# Patient Record
Sex: Female | Born: 1990 | Race: White | Hispanic: No | Marital: Single | Smoking: Never smoker
Health system: Southern US, Community
[De-identification: ages and names within clinical notes are randomized; demographics above are authoritative.]

## PROBLEM LIST (undated history)

## (undated) DIAGNOSIS — J069 Acute upper respiratory infection, unspecified: Secondary | ICD-10-CM

## (undated) DIAGNOSIS — Z0000 Encounter for general adult medical examination without abnormal findings: Principal | ICD-10-CM

## (undated) DIAGNOSIS — J329 Chronic sinusitis, unspecified: Principal | ICD-10-CM

## (undated) DIAGNOSIS — S62102A Fracture of unspecified carpal bone, left wrist, initial encounter for closed fracture: Secondary | ICD-10-CM

## (undated) DIAGNOSIS — T7840XA Allergy, unspecified, initial encounter: Secondary | ICD-10-CM

## (undated) DIAGNOSIS — S93409A Sprain of unspecified ligament of unspecified ankle, initial encounter: Secondary | ICD-10-CM

## (undated) HISTORY — PX: OTHER SURGICAL HISTORY: SHX169

## (undated) HISTORY — DX: Acute upper respiratory infection, unspecified: J06.9

## (undated) HISTORY — DX: Fracture of unspecified carpal bone, left wrist, initial encounter for closed fracture: S62.102A

## (undated) HISTORY — DX: Chronic sinusitis, unspecified: J32.9

## (undated) HISTORY — DX: Allergy, unspecified, initial encounter: T78.40XA

## (undated) HISTORY — DX: Sprain of unspecified ligament of unspecified ankle, initial encounter: S93.409A

## (undated) HISTORY — DX: Encounter for general adult medical examination without abnormal findings: Z00.00

---
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
## 2009-12-23 ENCOUNTER — Ambulatory Visit: Payer: Self-pay | Admitting: Family Medicine

## 2009-12-23 DIAGNOSIS — R0989 Other specified symptoms and signs involving the circulatory and respiratory systems: Secondary | ICD-10-CM | POA: Insufficient documentation

## 2009-12-23 DIAGNOSIS — L708 Other acne: Secondary | ICD-10-CM | POA: Insufficient documentation

## 2009-12-23 DIAGNOSIS — R011 Cardiac murmur, unspecified: Secondary | ICD-10-CM | POA: Insufficient documentation

## 2009-12-23 DIAGNOSIS — R0609 Other forms of dyspnea: Secondary | ICD-10-CM | POA: Insufficient documentation

## 2009-12-24 LAB — CONVERTED CEMR LAB
AST: 23 units/L (ref 0–37)
BUN: 14 mg/dL (ref 6–23)
Basophils Relative: 0.6 % (ref 0.0–3.0)
Calcium: 8.9 mg/dL (ref 8.4–10.5)
Creatinine, Ser: 0.7 mg/dL (ref 0.4–1.2)
Eosinophils Relative: 0.5 % (ref 0.0–5.0)
GFR calc non Af Amer: 113.56 mL/min (ref >60)
Glucose, Bld: 95 mg/dL (ref 70–99)
HCT: 38.6 % (ref 36.0–46.0)
Lymphs Abs: 3 10*3/uL (ref 0.7–4.0)
MCV: 93.6 fL (ref 78.0–100.0)
Monocytes Absolute: 0.6 10*3/uL (ref 0.1–1.0)
Platelets: 243 10*3/uL (ref 150.0–400.0)
Sodium: 140 meq/L (ref 135–145)
TSH: 0.42 microintl units/mL (ref 0.35–5.50)
Total Bilirubin: 1.4 mg/dL — ABNORMAL HIGH (ref 0.3–1.2)
WBC: 7.7 10*3/uL (ref 4.5–10.5)

## 2010-01-07 ENCOUNTER — Ambulatory Visit: Payer: Self-pay

## 2010-01-07 ENCOUNTER — Ambulatory Visit: Payer: Self-pay | Admitting: Cardiology

## 2010-01-07 ENCOUNTER — Ambulatory Visit (HOSPITAL_COMMUNITY): Admission: RE | Admit: 2010-01-07 | Discharge: 2010-01-07 | Payer: Self-pay | Admitting: Cardiology

## 2010-03-10 ENCOUNTER — Telehealth: Payer: Self-pay | Admitting: Family Medicine

## 2010-06-03 ENCOUNTER — Ambulatory Visit
Admission: RE | Admit: 2010-06-03 | Discharge: 2010-06-03 | Payer: Self-pay | Source: Home / Self Care | Attending: Family Medicine | Admitting: Family Medicine

## 2010-07-01 ENCOUNTER — Encounter: Payer: Self-pay | Admitting: Family Medicine

## 2010-07-02 ENCOUNTER — Encounter: Payer: Self-pay | Admitting: Family Medicine

## 2010-07-03 NOTE — Assessment & Plan Note (Signed)
Summary: NEW PT EST // RS   Vital Signs:  Patient profile:   20 year old female Menstrual status:  regular LMP:     12/07/2009 Height:      65 inches (165.10 cm) Weight:      119 pounds (54.09 kg) BMI:     19.87 O2 Sat:      98 % on Room air Temp:     98.5 degrees F (36.94 degrees C) oral Pulse rate:   71 / minute BP sitting:   108 / 70  (left arm) Cuff size:   regular  Vitals Entered By: Josph Macho RMA (December 23, 2009 3:44 PM)  O2 Flow:  Room air CC: Establish new pt/ CF Is Patient Diabetic? No LMP (date): 12/07/2009     Menstrual Status regular Enter LMP: 12/07/2009   History of Present Illness: Patient in today for new patient appointment with several concerns she has a long history of acne vulgaris on her face and would like a referral to a new Dermatologist. Has been on Yaz and Minocycline intermittently to help acne and it usually helps. Has not been taking the meds regularly at present.  She is concerned about heart murmur that had been picked up on routine exam, she had been referred to cardiology for evaluation and echocardiogram but before the echo could be performed the cardiologist actually passed away from an MI. She would like to proceed with further evaluation and is noting some symptoms of worsening DOE. She notes even with slight exercise she gets overheated, excessively sweaty and SOB to the point where she needs to rest before proceeding. She describes a family history of missing teeth and small jaws both she and her sister have it and she has already had several teeth implanted and is awaiting a bone graft and implants to lower jaw.  Preventive Screening-Counseling & Management  Alcohol-Tobacco     Smoking Status: never  Caffeine-Diet-Exercise     Does Patient Exercise: yes      Drug Use:  no.    Current Medications (verified): 1)  Yaz 3-0.02 Mg Tabs (Drospirenone-Ethinyl Estradiol) .... 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, applications Single Never Smoked Alcohol use-no Drug use-no Regular exercise-yes Occupation:  employed Smoking Status:  never Drug Use:  no Does Patient Exercise:  yes  Review of Systems  The patient denies anorexia, fever, weight loss, weight gain, vision loss, decreased hearing, hoarseness, chest pain, syncope, dyspnea on exertion, peripheral edema, prolonged cough, headaches, hemoptysis, abdominal pain, melena, hematochezia, severe indigestion/heartburn, hematuria, incontinence, genital sores, muscle weakness, suspicious skin lesions, transient blindness, difficulty walking, depression, unusual weight change, abnormal bleeding, enlarged lymph nodes, angioedema, breast masses, and testicular masses.    Physical Exam  General:  Well-developed,well-nourished,in no acute distress; alert,appropriate and cooperative throughout examination Head:  Normocephalic and atraumatic without obvious abnormalities Eyes:  No corneal or conjunctival inflammation noted. EOMI. Perrla.  Ears:  External ear exam shows no significant lesions or deformities.  Otoscopic examination reveals clear canals, tympanic membranes are intact bilaterally without  bulging, retraction, inflammation or discharge. Hearing is grossly normal bilaterally. Nose:  External nasal examination shows no deformity or inflammation. Nasal mucosa are pink and moist without lesions or exudates. Mouth:  Oral mucosa and oropharynx without lesions or  exudates.   Neck:  No deformities, masses, or tenderness noted. Lungs:  Normal respiratory effort, chest expands symmetrically. Lungs are clear to auscultation, no crackles or wheezes. Heart:  Normal rate and regular rhythm. S1 and S2 normal without gallop1 /6 systolic murmur.   Abdomen:  Bowel sounds positive,abdomen soft and non-tender without masses, organomegaly or hernias noted. Msk:  No deformity or scoliosis noted of thoracic or lumbar spine.   Pulses:  R and L carotid,dorsalis pedis and posterior tibial pulses are full and equal bilaterally Extremities:  No clubbing, cyanosis, edema, or deformity noted with normal full range of motion of all joints.   Neurologic:  No cranial nerve deficits noted. Station and gait are normal. Plantar reflexes are down-going bilaterally. DTRs are symmetrical throughout. Sensory, motor and coordinative functions appear intact. Skin:  Intact without suspicious lesions or rashes Cervical Nodes:  No lymphadenopathy noted Psych:  Cognition and judgment appear intact. Alert and cooperative with normal attention span and concentration. No apparent delusions, illusions, hallucinations   Impression & Recommendations:  Problem # 1:  PHYSICAL EXAMINATION (ICD-V70.0)  Orders: TLB-TSH (Thyroid Stimulating Hormone) (84443-TSH) TLB-Hepatic/Liver Function Pnl (80076-HEPATIC) TLB-CBC Platelet - w/Differential (85025-CBCD) TLB-BMP (Basic Metabolic Panel-BMET) (80048-METABOL) TLB-Lipid Panel (80061-LIPID) Venipuncture (78295) Specimen Handling (62130)  Problem # 2:  ACNE VULGARIS, FACIAL (ICD-706.1)  Orders: Dermatology Referral (Derma) Try Cetaphil soap, witch hazel astringent and benzoyl peroxide products as needed, may cont current meds  Problem # 3:  CARDIAC MURMUR (ICD-785.2)  Orders: Echo Referral (Echo) Cardiology Referral (Cardiology) with DOE, referred to cardiology for further evaluation  Problem # 4:  DYSPNEA ON EXERTION (ICD-786.09) Referred  for cardiac eval. report worsening symptoms  Complete Medication List: 1)  Yaz 3-0.02 Mg Tabs (Drospirenone-ethinyl estradiol) .... Once daily  Patient Instructions: 1)  Please sign release of records from [facility] 2)  Try Cetaphil Soap, Witch Hazel Astringent and Benzoyl Peroxide products daily to lesions. 3)  Please schedule a follow-up appointment in 1 year for annual exam

## 2010-07-03 NOTE — Assessment & Plan Note (Signed)
Summary: face broken out wants to get back on birth control/dt   Vital Signs:  Patient profile:   20 year old female Menstrual status:  regular Height:      65 inches (165.10 cm) Weight:      115.50 pounds (52.50 kg) O2 Sat:      100 % on Room air Temp:     98.3 degrees F (36.83 degrees C) oral Pulse rate:   74 / minute BP sitting:   117 / 74  (right arm) Cuff size:   regular  Vitals Entered By: Josph Macho RMA (June 03, 2010 9:47 AM)  O2 Flow:  Room air CC: Face broken out/ wants to be back on birth control/ CF Is Patient Diabetic? No   History of Present Illness: Patient is 20 year old Caucasian female in today for evaluation of her acne. She had a bad flare recently. She notes it has started to improve secondary to a course of doxycycline given to her by her dentist for dental work. She has had long-standing history of trouble with cystic facial acne, was on OCPs, mainly Yaz  during high school and early college to good effect. Did not have trouble with severe acne again until recently. Would like to go back on something but didn't feel emotionally well and her affect was flattened on Yaz. She does not have any menstrual concerns no heavy periods no terrible cramping is not 6 active. She tolerated the well otherwise no nausea no vomiting no changes in her cycle. She denies any recent illness, fevers, chills, chest pain, palpitations, headache, shortness of breath, GI or GU complaints. She is presently using some over-the-counter acne products with minimal effect. Her minocycline is on hold during her course of doxycycline  Current Medications (verified): 1)  Yaz 3-0.02 Mg Tabs (Drospirenone-Ethinyl Estradiol) .... Once Daily 2)  Minocycline Hcl 50 Mg Tabs (Minocycline Hcl) .... By Mouth Once Daily  Allergies (verified): No Known Drug Allergies  Past History:  Past medical history reviewed for relevance to current acute and chronic problems. Social history (including risk  factors) reviewed for relevance to current acute and chronic problems.  Social History: Reviewed history from 12/23/2009 and no changes required. Occupation:[employer]  Pre Dental Single Never Smoked Alcohol use-no Drug use-no Regular exercise-yes  Review of Systems      See HPI  Physical Exam  General:  Well-developed,well-nourished,in no acute distress; alert,appropriate and cooperative throughout examination Head:  Normocephalic and atraumatic without obvious abnormalities. No apparent alopecia or balding. Mouth:  Oral mucosa and oropharynx without lesions or exudates.  Teeth in good repair. Neck:  No deformities, masses, or tenderness noted. Lungs:  Normal respiratory effort, chest expands symmetrically. Lungs are clear to auscultation, no crackles or wheezes. Heart:  Normal rate and regular rhythm. S1 and S2 normal without gallop, murmur, click, rub or other extra sounds. Abdomen:  Bowel sounds positive,abdomen soft and non-tender without masses, organomegaly or hernias noted. Extremities:  No clubbing, cyanosis, edema, or deformity noted with normal full range of motion of all joints.   Skin:  cystic acne noted on forehead. Cervical Nodes:  slighlty enlarged lymph nodes b/l. Psych:  Cognition and judgment appear intact. Alert and cooperative with normal attention span and concentration. No apparent delusions, illusions, hallucinations   Impression & Recommendations:  Problem # 1:  ACNE VULGARIS, FACIAL (ICD-706.1)  Her updated medication list for this problem includes:    Minocycline Hcl 50 Mg Tabs (Minocycline hcl) ..... By mouth once daily  Restarted OCP with OrthoTricyclen Lo daily. If acne is improving can stop the Minocycline. Try Cetaphil Soap, Witch Hazel Astringent and Benzoyl Peroxide daily to face  Problem # 2:  CARDIAC MURMUR (ICD-785.2) No audible today and echo reassuring  Complete Medication List: 1)  Minocycline Hcl 50 Mg Tabs (Minocycline hcl) ....  By mouth once daily 2)  Ortho Tri-cyclen Lo 0.18/0.215/0.25 Mg-25 Mcg Tabs (Norgestim-eth estrad triphasic) .Marland Kitchen.. 1 tab by mouth daily  Patient Instructions: 1)  Please schedule a follow-up appointment as needed or for check up this summer while out of school. 2)  Start the Ortho Tricyclen Lo the first Sunday after your next menstrual cycle. 3)  Try Cetaphil Acne Soap daily, then cleanse with Witch Hazel Astringent after, then use Benzoyl Peroxide product on acne lesions Prescriptions: ORTHO TRI-CYCLEN LO 0.18/0.215/0.25 MG-25 MCG TABS (NORGESTIM-ETH ESTRAD TRIPHASIC) 1 tab by mouth daily  #3 packs x 3   Entered and Authorized by:   Stacey Blyth MD   Signed by:   Stacey Blyth MD on 06/03/2010   Method used:   [id number]       [address]       [place], [place]  [address]       Ph: [phone]       Fax: [phone]   RxID:   [id number]    Orders Added: 1)  Est. Patient Level II [75643]

## 2010-07-03 NOTE — Progress Notes (Signed)
Summary: New rx for Minocycline  Phone Note Call from Patient Call back at [phone]   Caller: Patient Reason for Call: Refill Medication Summary of Call: Patient called and wanted to know if you would prescibe her  Minoclycin. Her presvious dermatologist, Dr. Lovenia Kim @ [facility] was prescibing this but she is now trying to find a new dermatologist. She she was previously taking this and the YAZ for her acne. She does not want to take the YAZ anymore. If you do not prefer this medicine she said she would like something to treat her acne breakout. She is curently in school at [employer] Please advise.    [facility] [address]  Initial call taken by: Harold Barban,  March 10, 2010 12:50 PM  Follow-up for Phone Call        She can have the Minocycline 50mg  by mouth once daily is a typical maintenance dose, give #30 with 2 rf, needs to take a probiotic such as Align caps daily while on antibiotics as well. Wash with Cetaphil soap, apply Witch Hazel Astringent to entire face after that daily to bid Follow-up by: Danise Edge MD,  March 10, 2010 4:08 PM  Additional Follow-up for Phone Call Additional follow up Details #1::        Message left for pt to return my call. Need to know what pharmacy to send RX to? Additional Follow-up by: Josph Macho RMA,  March 11, 2010 9:33 AM    Additional Follow-up for Phone Call Additional follow up Details #2::    Informed pt Follow-up by: Josph Macho RMA,  March 12, 2010 8:38 AM  New/Updated Medications: MINOCYCLINE HCL 50 MG TABS (MINOCYCLINE HCL) by mouth once daily Prescriptions: MINOCYCLINE HCL 50 MG TABS (MINOCYCLINE HCL) by mouth once daily  #30 x 2   Entered by:   Josph Macho RMA   Authorized by:   Danise Edge MD   Signed by:   Josph Macho RMA on 03/12/2010   Method used:   [facility]        [facility] [phone][facility] (retail)       [address]       [place], [place]  [address]       Ph: [phone]       Fax: [phone]   RxID:   [id number]

## 2010-08-07 ENCOUNTER — Encounter: Payer: Self-pay | Admitting: Family Medicine

## 2010-08-07 ENCOUNTER — Ambulatory Visit (INDEPENDENT_AMBULATORY_CARE_PROVIDER_SITE_OTHER): Payer: BC Managed Care – PPO | Admitting: Family Medicine

## 2010-08-07 DIAGNOSIS — B354 Tinea corporis: Secondary | ICD-10-CM

## 2010-08-07 DIAGNOSIS — R0609 Other forms of dyspnea: Secondary | ICD-10-CM

## 2010-08-07 DIAGNOSIS — L708 Other acne: Secondary | ICD-10-CM

## 2010-08-10 ENCOUNTER — Encounter: Payer: Self-pay | Admitting: Family Medicine

## 2010-08-19 NOTE — Assessment & Plan Note (Signed)
Summary: follow up   Vital Signs:  Patient profile:   20 year old female Menstrual status:  regular Height:      65 inches (165.10 cm) Weight:      119.25 pounds (54.20 kg) O2 Sat:      100 % on Room air Temp:     97.7 degrees F (36.50 degrees C) oral Pulse rate:   63 / minute BP sitting:   100 / 63  (right arm) Cuff size:   regular  Vitals Entered By: Josph Macho RMA (August 07, 2010 8:39 AM)  O2 Flow:  Room air CC: Follow-up visit/ CF Is Patient Diabetic? 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 She is doing well and denies any acute illness. She is pleased with her response to the OCP, her acne is significantly better and she is not longer having freguent, new lesions. She does note some persistent scarring on her back but that is not new. No recent fevers/chills/HA/abdominal pain/congestion/CP/palp/SOB/GI or GU c/o. She has light very tolerable periods on the Ortho-Tricyclen Lo. She has noted a small, scaley slightly itching lesion on her left wrist which has been present for a couple of weeks  Current Medications (verified): 1)  Ortho Tri-Cyclen Lo 0.18/0.215/0.25 Mg-25 Mcg Tabs (Norgestim-Eth Estrad Triphasic) .Marland Kitchen.. 1 Tab By Mouth Daily  Allergies (verified): No Known Drug Allergies  Past History:  Past medical history reviewed for relevance to current acute and chronic problems. Social history (including risk factors) reviewed for relevance to current acute and chronic problems.  Social History: Reviewed history from 12/23/2009 and no changes required. Occupation:[employer]  Pre Dental Single Never Smoked Alcohol use-no Drug use-no Regular exercise-yes  Review of Systems      See HPI  Physical Exam  General:  Well-developed,well-nourished,in no acute distress; alert,appropriate and cooperative throughout examination Head:  Normocephalic and atraumatic without obvious abnormalities.  No apparent alopecia or balding. Mouth:  Oral mucosa and oropharynx without lesions or exudates.  Teeth in good repair. Neck:  No deformities, masses, or tenderness noted. Lungs:  Normal respiratory effort, chest expands symmetrically. Lungs are clear to auscultation, no crackles or wheezes. Heart:  Normal rate and regular rhythm. S1 and S2 normal without gallop, murmur, click, rub or other extra sounds. Abdomen:  Bowel sounds positive,abdomen soft and non-tender without masses, organomegaly or hernias noted. Extremities:  No clubbing, cyanosis, edema, or deformity noted with normal full range of motion of all joints.   Skin:  scarring, flat, light brown scattered over back, no active new lesions Psych:  Cognition and judgment appear intact. Alert and cooperative with normal attention span and concentration. No apparent delusions, illusions, hallucinations   Impression & Recommendations:  Problem # 1:  ACNE VULGARIS, FACIAL (ICD-706.1)  The following medications were removed from the medication list:    Minocycline Hcl 50 Mg Tabs (Minocycline hcl) ..... By mouth once daily OrthoTricyclen Lo has helped no new lesions, still some scarring on back, offered reassurance they will fade and she may try a Benzoyl Peroxide product as well  Problem # 2:  TINEA CORPORIS (ICD-110.5) On abdomen, apply Lotrimin cream two times a day and report if lesions do not resolve  Problem # 3:  DYSPNEA ON EXERTION (ICD-786.09) Improved, no c/o today  Complete Medication List: 1)  Ortho Tri-cyclen Lo 0.18/0.215/0.25 Mg-25 Mcg Tabs (Norgestim-eth estrad triphasic) .Marland Kitchen.. 1 tab by mouth daily 2)  Lotrimin Af  1 % Crea (Clotrimazole) .... Apply to lesions two times a day  Patient Instructions: 1)  Please schedule a follow-up appointment in 4-5 months for annual exam 2)  call with any concerns 3)  try a Benzoyl Peroxide lotion for skin on back 4)  Apply Lotrimin two times a day  Prescriptions: ORTHO TRI-CYCLEN LO  0.18/0.215/0.25 MG-25 MCG TABS (NORGESTIM-ETH ESTRAD TRIPHASIC) 1 tab by mouth daily  #3 x 3   Entered and Authorized by:   Danise Edge MD   Signed by:   Danise Edge MD on 08/07/2010   Method used:   [facility]        [facility] [phone][facility] (retail)       [address]       [place], [place]  [address]       Ph: [phone]       Fax: [phone]   RxID:   [id number]    Orders Added: 1)  Est. Patient Level III [25366]

## 2010-11-10 ENCOUNTER — Other Ambulatory Visit: Payer: Self-pay | Admitting: Family Medicine

## 2010-11-10 DIAGNOSIS — F419 Anxiety disorder, unspecified: Secondary | ICD-10-CM

## 2010-11-10 NOTE — Telephone Encounter (Signed)
I have attempted to contact this patient by phone with the following results: left message to return my call on answering machine.

## 2010-11-10 NOTE — Telephone Encounter (Signed)
OK for her to have a small amount of Alprazolam 0.25mg  tabs, make sure to warn her it may cause sedation so I usually recommend patient's take 1 some evening at home before they travel so they know how it affects them. Give Alprazolam 0.25mg  1-2 tabs po daily prn anxiety. Disp: #10, no rf

## 2010-11-10 NOTE — Telephone Encounter (Signed)
Leaving for [place] on Thursday would like something for anxiety for the flight.

## 2010-11-11 MED ORDER — ALPRAZOLAM 0.25 MG PO TABS: ORAL_TABLET | ORAL | Status: DC

## 2010-11-11 NOTE — Telephone Encounter (Signed)
RC from pt.  She is agreeable with plan.  Pt questioned need for something also for motion sickness.  Advised I could ask about calling in Transderm Scope patches also. Pt states she doesn't think it is that bad and states she picked up dramamine at pharmacy.  Advised she should be OK with that as alprazolam will also relax her.  Pt does have traveling companion so she will not be alone on plane and will be able to sleep.  She is agreeable.  Please advise any further instructions. Thanks

## 2010-11-11 NOTE — Telephone Encounter (Signed)
Perfect, if she calls back and changes her mind she may have a patch for motion sickness but I doubt she will need it.

## 2011-06-11 ENCOUNTER — Other Ambulatory Visit: Payer: Self-pay

## 2011-06-11 MED ORDER — NORGESTIM-ETH ESTRAD TRIPHASIC 0.18/0.215/0.25 MG-25 MCG PO TABS: 1.0000 | ORAL_TABLET | Freq: Every day | ORAL | Status: DC

## 2011-06-11 NOTE — Telephone Encounter (Signed)
Pt informed that 1 month was sent to pharmacy but pt needs an appt for further refills due to it being a year since we seen patient

## 2011-07-24 ENCOUNTER — Encounter: Payer: Self-pay | Admitting: Family Medicine

## 2011-07-24 ENCOUNTER — Ambulatory Visit (INDEPENDENT_AMBULATORY_CARE_PROVIDER_SITE_OTHER): Payer: BC Managed Care – PPO | Admitting: Family Medicine

## 2011-07-24 DIAGNOSIS — Z0000 Encounter for general adult medical examination without abnormal findings: Secondary | ICD-10-CM

## 2011-07-24 DIAGNOSIS — L708 Other acne: Secondary | ICD-10-CM

## 2011-07-24 DIAGNOSIS — T7840XA Allergy, unspecified, initial encounter: Secondary | ICD-10-CM

## 2011-07-24 HISTORY — DX: Encounter for general adult medical examination without abnormal findings: Z00.00

## 2011-07-24 MED ORDER — DESOGESTREL-ETHINYL ESTRADIOL 0.15-0.02/0.01 MG (21/5) PO TABS: 1.0000 | ORAL_TABLET | Freq: Every day | ORAL | Status: DC

## 2011-07-24 NOTE — Assessment & Plan Note (Signed)
Ortho tricyclen lo has not been helpful for the acne. Will try to change to [facility]. Did well previously on Yaz. Has been using topical treatment as well

## 2011-07-24 NOTE — Patient Instructions (Signed)

## 2011-07-26 ENCOUNTER — Encounter: Payer: Self-pay | Admitting: Family Medicine

## 2011-07-26 DIAGNOSIS — T7840XA Allergy, unspecified, initial encounter: Secondary | ICD-10-CM | POA: Insufficient documentation

## 2011-07-26 HISTORY — DX: Allergy, unspecified, initial encounter: T78.40XA

## 2011-07-26 NOTE — Progress Notes (Signed)
Patient ID: Bonnie Hayes, female   DOB: [birth year], 21 y.o.   MRN: [id number] Bonnie Hayes [id number] May 06, 1991 07/26/2011      Progress Note New Patient  Subjective  Chief Complaint  Chief Complaint  Patient presents with  . Annual Exam    physical    HPI  Patient is a 21 year old Caucasian female who is in today for a annual exam. Overall she's doing well. She did have a rub that over the fall semester with a recurrent swelling in her eyes allergies in the questionable work up or reaction. Zyrtec once daily and Flonase once daily helps some the course of prednisone seemed to help the most. She denies any new products anything obvious trigger. She presently continues to struggle with some nasal congestion postnasal drip but is not struggling with the swelling in the eyes or discomfort. No fevers or chills. She continues to struggle with acne and would like to try a different OCP. She has recently been started back on Differin gel and does believe that it is helping some. No fevers, chills, chest pain, palpitations, shortness of breath, GI or GU complaints otherwise noted today.  Past Medical History  Diagnosis Date  . Left wrist fracture     at radial aspect, no surgical correction required  . Preventative health care 07/24/2011    Past Surgical History  Procedure Date  . Teeth implanted   . Thumb corrected     left thumb surgically corrected    Family History  Problem Relation Age of Onset  . Hypertension Mother   . Hyperlipidemia Father   . Hypertension Maternal Grandmother   . Heart murmur Maternal Grandfather   . Coronary artery disease Maternal Grandfather     s/p CABG X6 vessels and pacer placed  . Diabetes Paternal Grandmother   . Hyperlipidemia Paternal Grandfather   . Cancer Paternal Grandfather     prostate    History   Social History  . Marital Status: Single    Spouse Name: N/A    Number of Children: N/A  . Years of Education: N/A    Occupational History  . Not on file.   Social History Main Topics  . Smoking status: Never Smoker   . Smokeless tobacco: Not on file  . Alcohol Use: Yes     occasionally  . Drug Use: No  . Sexually Active: No   Other Topics Concern  . Not on file   Social History Narrative  . No narrative on file    No current outpatient prescriptions on file prior to visit.    No Known Allergies  Review of Systems  Review of Systems  Constitutional: Negative for fever, chills and malaise/fatigue.  HENT: Negative for hearing loss, nosebleeds and congestion.   Eyes: Negative for discharge.  Respiratory: Negative for cough, sputum production, shortness of breath and wheezing.   Cardiovascular: Negative for chest pain, palpitations and leg swelling.  Gastrointestinal: Negative for heartburn, nausea, vomiting, abdominal pain, diarrhea, constipation and blood in stool.  Genitourinary: Negative for dysuria, urgency, frequency and hematuria.  Musculoskeletal: Negative for myalgias, back pain and falls.  Skin: Negative for rash.  Neurological: Negative for dizziness, tremors, sensory change, focal weakness, loss of consciousness, weakness and headaches.  Endo/Heme/Allergies: Negative for polydipsia. Does not bruise/bleed easily.  Psychiatric/Behavioral: Negative for depression and suicidal ideas. The patient is not nervous/anxious and does not have insomnia.     Objective  BP 125/81  Pulse 75  Temp(Src) 98.2 F (36.8  C) (Temporal)  Ht 5\' 5"  (1.651 m)  Wt 123 lb 12.8 oz (56.155 kg)  BMI 20.60 kg/m2  SpO2 100%  LMP 07/23/2011  Physical Exam  Physical Exam  Constitutional: She is oriented to person, place, and time and well-developed, well-nourished, and in no distress. No distress.  HENT:  Head: Normocephalic and atraumatic.  Right Ear: External ear normal.  Left Ear: External ear normal.  Nose: Nose normal.  Mouth/Throat: Oropharynx is clear and moist. No oropharyngeal exudate.   Eyes: Conjunctivae are normal. Pupils are equal, round, and reactive to light. Right eye exhibits no discharge. Left eye exhibits no discharge. No scleral icterus.  Neck: Normal range of motion. Neck supple. No thyromegaly present.  Cardiovascular: Normal rate, regular rhythm, normal heart sounds and intact distal pulses.   No murmur heard. Pulmonary/Chest: Effort normal and breath sounds normal. No respiratory distress. She has no wheezes. She has no rales.  Abdominal: Soft. Bowel sounds are normal. She exhibits no distension and no mass. There is no tenderness.  Musculoskeletal: Normal range of motion. She exhibits no edema and no tenderness.  Lymphadenopathy:    She has no cervical adenopathy.  Neurological: She is alert and oriented to person, place, and time. She has normal reflexes. No cranial nerve deficit. Coordination normal.  Skin: Skin is warm and dry. No rash noted. She is not diaphoretic.  Psychiatric: Mood, memory and affect normal.       Assessment & Plan  ACNE VULGARIS, FACIAL Ortho tricyclen lo has not been helpful for the acne. Will try to change to [facility]. Did well previously on Yaz. Has been using topical treatment as well  Preventative health care Doing very well and about to graduate from college. Will return annually or as needed. Encouraged heart healthy diet, seat belt, regular exercise  Allergic state Had extensive allergy testing and never found a cause of her swollen eyes. Had several episodes. Prednisone was helpful. If episodes recur she is encouraged to take Zyrtec 10 mg twice daily and use her Flonase regularly consider further workup by allergist if symptoms worsen.

## 2011-07-26 NOTE — Assessment & Plan Note (Addendum)
Doing very well and about to graduate from college. Will return annually or as needed. Encouraged heart healthy diet, seat belt, regular exercise

## 2011-07-26 NOTE — Assessment & Plan Note (Signed)
Had extensive allergy testing and never found a cause of her swollen eyes. Had several episodes. Prednisone was helpful. If episodes recur she is encouraged to take Zyrtec 10 mg twice daily and use her Flonase regularly consider further workup by allergist if symptoms worsen.

## 2011-08-18 ENCOUNTER — Other Ambulatory Visit: Payer: Self-pay | Admitting: *Deleted

## 2011-08-18 DIAGNOSIS — L708 Other acne: Secondary | ICD-10-CM

## 2011-08-18 MED ORDER — DESOGESTREL-ETHINYL ESTRADIOL 0.15-0.02/0.01 MG (21/5) PO TABS: 1.0000 | ORAL_TABLET | Freq: Every day | ORAL | Status: DC

## 2012-06-08 ENCOUNTER — Ambulatory Visit (INDEPENDENT_AMBULATORY_CARE_PROVIDER_SITE_OTHER): Payer: BC Managed Care – PPO | Admitting: Family Medicine

## 2012-06-08 ENCOUNTER — Encounter: Payer: Self-pay | Admitting: Family Medicine

## 2012-06-08 VITALS — BP 109/72 | HR 66 | Temp 98.4°F | Ht 65.0 in | Wt 122.4 lb

## 2012-06-08 DIAGNOSIS — J069 Acute upper respiratory infection, unspecified: Secondary | ICD-10-CM

## 2012-06-08 DIAGNOSIS — T7840XA Allergy, unspecified, initial encounter: Secondary | ICD-10-CM

## 2012-06-08 DIAGNOSIS — J329 Chronic sinusitis, unspecified: Secondary | ICD-10-CM | POA: Insufficient documentation

## 2012-06-08 HISTORY — DX: Acute upper respiratory infection, unspecified: J06.9

## 2012-06-08 HISTORY — DX: Chronic sinusitis, unspecified: J32.9

## 2012-06-08 MED ORDER — FLUTICASONE PROPIONATE 50 MCG/ACT NA SUSP: 2.0000 | Freq: Every day | NASAL | Status: DC

## 2012-06-08 MED ORDER — PSEUDOEPHEDRINE-GUAIFENESIN ER 60-600 MG PO TB12: 1.0000 | ORAL_TABLET | Freq: Two times a day (BID) | ORAL | Status: DC

## 2012-06-08 NOTE — Patient Instructions (Addendum)
Try AFrin and/or Nyguil at bed and increase fluids and rest   Sinusitis Sinusitis is redness, soreness, and swelling (inflammation) of the paranasal sinuses. Paranasal sinuses are air pockets within the bones of your face (beneath the eyes, the middle of the forehead, or above the eyes). In healthy paranasal sinuses, mucus is able to drain out, and air is able to circulate through them by way of your nose. However, when your paranasal sinuses are inflamed, mucus and air can become trapped. This can allow bacteria and other germs to grow and cause infection. Sinusitis can develop quickly and last only a short time (acute) or continue over a long period (chronic). Sinusitis that lasts for more than 12 weeks is considered chronic.  CAUSES  Causes of sinusitis include:  Allergies.  Structural abnormalities, such as displacement of the cartilage that separates your nostrils (deviated septum), which can decrease the air flow through your nose and sinuses and affect sinus drainage.  Functional abnormalities, such as when the small hairs (cilia) that line your sinuses and help remove mucus do not work properly or are not present. SYMPTOMS  Symptoms of acute and chronic sinusitis are the same. The primary symptoms are pain and pressure around the affected sinuses. Other symptoms include:  Upper toothache.  Earache.  Headache.  Bad breath.  Decreased sense of smell and taste.  A cough, which worsens when you are lying flat.  Fatigue.  Fever.  Thick drainage from your nose, which often is green and may contain pus (purulent).  Swelling and warmth over the affected sinuses. DIAGNOSIS  Your caregiver will perform a physical exam. During the exam, your caregiver may:  Look in your nose for signs of abnormal growths in your nostrils (nasal polyps).  Tap over the affected sinus to check for signs of infection.  View the inside of your sinuses (endoscopy) with a special imaging device with  a light attached (endoscope), which is inserted into your sinuses. If your caregiver suspects that you have chronic sinusitis, one or more of the following tests may be recommended:  Allergy tests.  Nasal culture A sample of mucus is taken from your nose and sent to a lab and screened for bacteria.  Nasal cytology A sample of mucus is taken from your nose and examined by your caregiver to determine if your sinusitis is related to an allergy. TREATMENT  Most cases of acute sinusitis are related to a viral infection and will resolve on their own within 10 days. Sometimes medicines are prescribed to help relieve symptoms (pain medicine, decongestants, nasal steroid sprays, or saline sprays).  However, for sinusitis related to a bacterial infection, your caregiver will prescribe antibiotic medicines. These are medicines that will help kill the bacteria causing the infection.  Rarely, sinusitis is caused by a fungal infection. In theses cases, your caregiver will prescribe antifungal medicine. For some cases of chronic sinusitis, surgery is needed. Generally, these are cases in which sinusitis recurs more than 3 times per year, despite other treatments. HOME CARE INSTRUCTIONS   Drink plenty of water. Water helps thin the mucus so your sinuses can drain more easily.  Use a humidifier.  Inhale steam 3 to 4 times a day (for example, sit in the bathroom with the shower running).  Apply a warm, moist washcloth to your face 3 to 4 times a day, or as directed by your caregiver.  Use saline nasal sprays to help moisten and clean your sinuses.  Take over-the-counter or prescription medicines for  pain, discomfort, or fever only as directed by your caregiver. SEEK IMMEDIATE MEDICAL CARE IF:  You have increasing pain or severe headaches.  You have nausea, vomiting, or drowsiness.  You have swelling around your face.  You have vision problems.  You have a stiff neck.  You have difficulty  breathing. MAKE SURE YOU:   Understand these instructions.  Will watch your condition.  Will get help right away if you are not doing well or get worse. Document Released: 05/18/2005 Document Revised: 08/10/2011 Document Reviewed: 06/02/2011 [employer] Patient Information 2013 [place], [place].

## 2012-06-08 NOTE — Assessment & Plan Note (Signed)
Start Fluticasone nasal daily, rx provided

## 2012-06-08 NOTE — Progress Notes (Signed)
Patient ID: Bonnie Hayes, female   DOB: 01-Aug-1990, 22 y.o.   MRN: [id number] PHILLIS THACKERAY [id number] 07/25/1990 06/08/2012      Progress Note-Follow Up  Subjective  Chief Complaint  Chief Complaint  Patient presents with  . Chills    and nasal drainage X 2 days    HPI  Patient is a 22 year old Caucasian female who is in today with a 2 to three-day history of chills, malaise and nasal congestion. She is scheduled for an interview at the dental school in [place] this weekend and is concerned about her health. She tried some plain Mucinex and some phenylephrine he got partial relief. No high-grade fevers. She has had mild occasional cough but nothing productive or severe. No chest congestion or significant head pain. No sore throat, GI or GU complaints otherwise noted  Past Medical History  Diagnosis Date  . Left wrist fracture     at radial aspect, no surgical correction required  . Preventative health care 07/24/2011  . Allergic state 07/26/2011  . Sinusitis 06/08/2012  . Viral URI 06/08/2012    Past Surgical History  Procedure Date  . Teeth implanted   . Thumb corrected     left thumb surgically corrected    Family History  Problem Relation Age of Onset  . Hypertension Mother   . Hyperlipidemia Father   . Hypertension Maternal Grandmother   . Heart murmur Maternal Grandfather   . Coronary artery disease Maternal Grandfather     s/p CABG X6 vessels and pacer placed  . Diabetes Paternal Grandmother   . Hyperlipidemia Paternal Grandfather   . Cancer Paternal Grandfather     prostate    History   Social History  . Marital Status: Single    Spouse Name: N/A    Number of Children: N/A  . Years of Education: N/A   Occupational History  . Not on file.   Social History Main Topics  . Smoking status: Never Smoker   . Smokeless tobacco: Not on file  . Alcohol Use: Yes     Comment: occasionally  . Drug Use: No  . Sexually Active: No   Other Topics Concern    . Not on file   Social History Narrative  . No narrative on file    Current Outpatient Prescriptions on File Prior to Visit  Medication Sig Dispense Refill  . adapalene (DIFFERIN) 0.1 % gel       . desogestrel-ethinyl estradiol (KARIVA) 0.15-0.02/0.01 MG (21/5) tablet Take 1 tablet by mouth daily.  3 Package  3  . fluticasone (FLONASE) 50 MCG/ACT nasal spray Place 2 sprays into the nose daily.  16 g  6    No Known Allergies  Review of Systems  Review of Systems  Constitutional: Positive for chills and malaise/fatigue. Negative for fever.  HENT: Positive for congestion.   Eyes: Negative for discharge.  Respiratory: Positive for cough. Negative for shortness of breath.   Cardiovascular: Negative for chest pain, palpitations and leg swelling.  Gastrointestinal: Negative for nausea, abdominal pain and diarrhea.  Genitourinary: Negative for dysuria.  Musculoskeletal: Negative for falls.  Skin: Negative for rash.  Neurological: Negative for loss of consciousness and headaches.  Endo/Heme/Allergies: Negative for polydipsia.  Psychiatric/Behavioral: Negative for depression and suicidal ideas. The patient is not nervous/anxious and does not have insomnia.     Objective  BP 109/72  Pulse 66  Temp 98.4 F (36.9 C) (Temporal)  Ht 5\' 5"  (1.651 m)  Wt 122 lb  6.4 oz (55.52 kg)  BMI 20.37 kg/m2  SpO2 95%  LMP 05/25/2012  Physical Exam  Physical Exam  Constitutional: She is oriented to person, place, and time and well-developed, well-nourished, and in no distress. No distress.  HENT:  Head: Normocephalic and atraumatic.       Nasal mucosa boggy and erythematous  Eyes: Conjunctivae normal are normal.  Neck: Neck supple. No thyromegaly present.  Cardiovascular: Normal rate, regular rhythm and normal heart sounds.   No murmur heard. Pulmonary/Chest: Effort normal and breath sounds normal. She has no wheezes.  Abdominal: She exhibits no distension and no mass.  Musculoskeletal:  She exhibits no edema.  Lymphadenopathy:    She has no cervical adenopathy.  Neurological: She is alert and oriented to person, place, and time.  Skin: Skin is warm and dry. No rash noted. She is not diaphoretic.  Psychiatric: Memory, affect and judgment normal.    Lab Results  Component Value Date   TSH 0.42 12/23/2009   Lab Results  Component Value Date   WBC 7.7 12/23/2009   HGB 13.1 12/23/2009   HCT 38.6 12/23/2009   MCV 93.6 12/23/2009   PLT 243.0 12/23/2009   Lab Results  Component Value Date   CREATININE 0.7 12/23/2009   BUN 14 12/23/2009   NA 140 12/23/2009   K 3.6 12/23/2009   CL 106 12/23/2009   CO2 30 12/23/2009   Lab Results  Component Value Date   ALT 17 12/23/2009   AST 23 12/23/2009   ALKPHOS 51 12/23/2009   BILITOT 1.4* 12/23/2009   Lab Results  Component Value Date   CHOL 168 12/23/2009   Lab Results  Component Value Date   HDL 55.60 12/23/2009   Lab Results  Component Value Date   LDLCALC 91 12/23/2009   Lab Results  Component Value Date   TRIG 106.0 12/23/2009   Lab Results  Component Value Date   CHOLHDL 3 12/23/2009     Assessment & Plan  Viral URI Increase rest and hydration, nasal saline bid and prn, Mucinex D bid, may use AFrin and/or Nyquil only qhs call if symptoms worsen  Allergic state Start Fluticasone nasal daily, rx provided

## 2012-06-08 NOTE — Assessment & Plan Note (Signed)
Increase rest and hydration, nasal saline bid and prn, Mucinex D bid, may use AFrin and/or Nyquil only qhs call if symptoms worsen

## 2012-06-15 ENCOUNTER — Encounter: Payer: Self-pay | Admitting: Family Medicine

## 2012-06-15 ENCOUNTER — Ambulatory Visit (INDEPENDENT_AMBULATORY_CARE_PROVIDER_SITE_OTHER): Payer: BC Managed Care – PPO | Admitting: Family Medicine

## 2012-06-15 VITALS — BP 115/74 | HR 76 | Temp 98.5°F | Ht 65.0 in | Wt 122.0 lb

## 2012-06-15 DIAGNOSIS — T7840XA Allergy, unspecified, initial encounter: Secondary | ICD-10-CM

## 2012-06-15 DIAGNOSIS — J329 Chronic sinusitis, unspecified: Secondary | ICD-10-CM

## 2012-06-15 MED ORDER — AMOXICILLIN-POT CLAVULANATE 875-125 MG PO TABS: 1.0000 | ORAL_TABLET | Freq: Two times a day (BID) | ORAL | Status: DC

## 2012-06-15 MED ORDER — PROBIOTIC PO CAPS: ORAL_CAPSULE | ORAL | Status: DC

## 2012-06-15 NOTE — Assessment & Plan Note (Signed)
Started on Augmentin 875 one tab twice a day. Mucinex D one tab by mouth twice a day. Saline nasal flushes 2-3 times daily.

## 2012-06-15 NOTE — Assessment & Plan Note (Signed)
Struggles with chronic allergies and meds he spring and fall are generally the worst. Takes Zyrtec intermittently but is not taking presently. Takes Flonase intermittently but is not taking presently. At this point she has [place] time and she is awaiting admission to dental. She's just accept the [employer]. She had her interview at [facility] but has not heard yet. Osseous and time we will send her for allergy testing and further intervention and she will abstain from taking her Zyrtec until seen by allergy and tested.

## 2012-06-15 NOTE — Progress Notes (Signed)
Patient ID: Bonnie Hayes, female   DOB: 06/23/1990, 22 y.o.   MRN: [id number] ADAH STONEBERG [id number] 1991-03-08 06/15/2012      Progress Note-Follow Up  Subjective  Chief Complaint  Chief Complaint  Patient presents with  . not feeling any better    HPI  Patient's 22 year old Caucasian female who is in today for persistence of symptoms for last week she was seen and diagnosed with a viral URI. Unfortunately since that time her sinus pressure is actually significantly increased. She was having fevers and chills and also resolved she has a lot of discomfort and pain in her teeth and along her cheeks correlate with maxillary sinuses. She has significant thick green rhinorrhea and malaise, postnasal drip. Denies any sore throat. Does have some mild pressure and pain near the ears but no discharge. No chest congestion, chest pain, palpitations, shortness of breath, GI or GU complaints noted today. 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 Left wrist fracture     at radial aspect, no surgical correction required  . Preventative health care 07/24/2011  . Allergic state 07/26/2011  . Sinusitis 06/08/2012  . Viral URI 06/08/2012  . Sinusitis 06/08/2012    Past Surgical History  Procedure Date  . Teeth implanted   . Thumb corrected     left thumb surgically corrected    Family History  Problem Relation Age of Onset  . Hypertension Mother   . Hyperlipidemia Father   . Hypertension Maternal Grandmother   . Heart murmur Maternal Grandfather   . Coronary artery disease Maternal Grandfather     s/p CABG X6 vessels and pacer placed  . Diabetes Paternal Grandmother   . Hyperlipidemia Paternal Grandfather   . Cancer Paternal Grandfather     prostate    History   Social History  . Marital Status: Single    Spouse Name: N/A    Number of Children: N/A  . Years of  Education: N/A   Occupational History  . Not on file.   Social History Main Topics  . Smoking status: Never Smoker   . Smokeless tobacco: Not on file  . Alcohol Use: Yes     Comment: occasionally  . Drug Use: No  . Sexually Active: No   Other Topics Concern  . Not on file   Social History Narrative  . No narrative on file    Current Outpatient Prescriptions on File Prior to Visit  Medication Sig Dispense Refill  . adapalene (DIFFERIN) 0.1 % gel       . desogestrel-ethinyl estradiol (KARIVA) 0.15-0.02/0.01 MG (21/5) tablet Take 1 tablet by mouth daily.  3 Package  3  . fluticasone (FLONASE) 50 MCG/ACT nasal spray Place 2 sprays into the nose daily.  16 g  6  . pseudoephedrine-guaifenesin (MUCINEX D) 60-600 MG per tablet Take 1 tablet by mouth every 12 (twelve) hours.        No Known Allergies  Review of Systems Review of Systems  Constitutional: Positive for malaise/fatigue. Negative for fever and chills.  HENT: Positive for congestion and sore throat.   Eyes: Negative for discharge.  Respiratory: Positive for sputum production. Negative for cough, shortness of breath and wheezing.   Cardiovascular: Negative for chest pain, palpitations and leg swelling.  Gastrointestinal: Negative for nausea, abdominal pain and diarrhea.  Genitourinary: Negative for dysuria.  Musculoskeletal:  Negative for falls.  Skin: Negative for rash.  Neurological: Positive for headaches. Negative for loss of consciousness.  Endo/Heme/Allergies: Negative for polydipsia.  Psychiatric/Behavioral: Negative for depression and suicidal ideas. The patient is not nervous/anxious and does not have insomnia.     Objective  BP 115/74  Pulse 76  Temp 98.5 F (36.9 C) (Temporal)  Ht 5\' 5"  (1.651 m)  Wt 122 lb (55.339 kg)  BMI 20.30 kg/m2  SpO2 100%  LMP 05/25/2012  Physical Exam  Physical Exam  Constitutional: She is well-developed, well-nourished, and in no distress. No distress.  HENT:  Head:  Normocephalic and atraumatic.  Right Ear: External ear normal.  Left Ear: External ear normal.  Mouth/Throat: No oropharyngeal exudate.       Oropharynx mildly erythematous and 0-1 tonsils noted b/l  Eyes: EOM are normal. Left eye exhibits no discharge. No scleral icterus.  Neck: No JVD present.  Cardiovascular: Normal heart sounds and intact distal pulses.   Pulmonary/Chest: No respiratory distress. She has no rales.  Abdominal: Soft. Bowel sounds are normal. She exhibits no distension and no mass. There is tenderness. There is no guarding.  Musculoskeletal: She exhibits no edema and no tenderness.  Lymphadenopathy:    She has no cervical adenopathy.  Skin: No rash noted. No erythema.    Lab Results  Component Value Date   TSH 0.42 12/23/2009   Lab Results  Component Value Date   WBC 7.7 12/23/2009   HGB 13.1 12/23/2009   HCT 38.6 12/23/2009   MCV 93.6 12/23/2009   PLT 243.0 12/23/2009   Lab Results  Component Value Date   CREATININE 0.7 12/23/2009   BUN 14 12/23/2009   NA 140 12/23/2009   K 3.6 12/23/2009   CL 106 12/23/2009   CO2 30 12/23/2009   Lab Results  Component Value Date   ALT 17 12/23/2009   AST 23 12/23/2009   ALKPHOS 51 12/23/2009   BILITOT 1.4* 12/23/2009   Lab Results  Component Value Date   CHOL 168 12/23/2009   Lab Results  Component Value Date   HDL 55.60 12/23/2009   Lab Results  Component Value Date   LDLCALC 91 12/23/2009   Lab Results  Component Value Date   TRIG 106.0 12/23/2009   Lab Results  Component Value Date   CHOLHDL 3 12/23/2009     Assessment & Plan  Sinusitis Started on Augmentin 875 one tab twice a day. Mucinex D one tab by mouth twice a day. Saline nasal flushes 2-3 times daily.  Allergic state Struggles with chronic allergies and meds he spring and fall are generally the worst. Takes Zyrtec intermittently but is not taking presently. Takes Flonase intermittently but is not taking presently. At this point she has [place] time and  she is awaiting admission to dental. She's just accept the [employer]. She had her interview at [facility] but has not heard yet. [place]se[place] and time we will send her for allergy testing and further intervention and she will abstain from taking her Zyrtec until seen by allergy and tested.

## 2012-06-15 NOTE — Patient Instructions (Addendum)

## 2012-07-20 ENCOUNTER — Other Ambulatory Visit: Payer: Self-pay | Admitting: Family Medicine

## 2012-07-28 ENCOUNTER — Telehealth: Payer: Self-pay

## 2012-07-28 NOTE — Telephone Encounter (Signed)
Pt left a message verifying if we received her immunization records.  I tried to call pt back to confirm but vm was not set up.

## 2012-07-29 ENCOUNTER — Ambulatory Visit (INDEPENDENT_AMBULATORY_CARE_PROVIDER_SITE_OTHER): Payer: BC Managed Care – PPO | Admitting: Family Medicine

## 2012-07-29 ENCOUNTER — Other Ambulatory Visit (HOSPITAL_COMMUNITY)
Admission: RE | Admit: 2012-07-29 | Discharge: 2012-07-29 | Disposition: A | Discharge status: Home / Self Care | Payer: BC Managed Care – PPO | Source: Ambulatory Visit | Attending: Family Medicine | Admitting: Family Medicine

## 2012-07-29 ENCOUNTER — Encounter: Payer: Self-pay | Admitting: Family Medicine

## 2012-07-29 VITALS — BP 119/76 | HR 66 | Temp 99.0°F | Ht 65.0 in | Wt 123.0 lb

## 2012-07-29 DIAGNOSIS — Z0000 Encounter for general adult medical examination without abnormal findings: Secondary | ICD-10-CM

## 2012-07-29 DIAGNOSIS — S93409A Sprain of unspecified ligament of unspecified ankle, initial encounter: Secondary | ICD-10-CM

## 2012-07-29 DIAGNOSIS — Z309 Encounter for contraceptive management, unspecified: Secondary | ICD-10-CM

## 2012-07-29 DIAGNOSIS — Z01419 Encounter for gynecological examination (general) (routine) without abnormal findings: Secondary | ICD-10-CM | POA: Insufficient documentation

## 2012-07-29 DIAGNOSIS — Z124 Encounter for screening for malignant neoplasm of cervix: Secondary | ICD-10-CM | POA: Insufficient documentation

## 2012-07-29 DIAGNOSIS — N760 Acute vaginitis: Secondary | ICD-10-CM | POA: Insufficient documentation

## 2012-07-29 LAB — LIPID PANEL
HDL: 61.8 mg/dL (ref >39.00)
LDL Cholesterol: 87 mg/dL (ref 0–99)
Total CHOL/HDL Ratio: 3

## 2012-07-29 LAB — RENAL FUNCTION PANEL
BUN: 10 mg/dL (ref 6–23)
CO2: 26 mEq/L (ref 19–32)
Calcium: 9.1 mg/dL (ref 8.4–10.5)
Creatinine, Ser: 0.7 mg/dL (ref 0.4–1.2)

## 2012-07-29 LAB — HEPATIC FUNCTION PANEL
ALT: 9 U/L (ref 0–35)
AST: 16 U/L (ref 0–37)
Albumin: 3.6 g/dL (ref 3.5–5.2)
Alkaline Phosphatase: 39 U/L (ref 39–117)
Total Bilirubin: 0.9 mg/dL (ref 0.3–1.2)

## 2012-07-29 LAB — CBC
HCT: 39.3 % (ref 36.0–46.0)
Hemoglobin: 13.2 g/dL (ref 12.0–15.0)
RBC: 4.27 Mil/uL (ref 3.87–5.11)
WBC: 6.1 10*3/uL (ref 4.5–10.5)

## 2012-07-29 LAB — TSH: TSH: 2.82 u[IU]/mL (ref 0.35–5.50)

## 2012-07-29 MED ORDER — ADAPALENE 0.1 % EX GEL: Freq: Every day | CUTANEOUS | Status: AC

## 2012-07-29 NOTE — Patient Instructions (Addendum)
Preventive Care for Adults, Female A healthy lifestyle and preventive care can promote health and wellness. Preventive health guidelines for women include the following key practices.  A routine yearly physical is a good way to check with your caregiver about your health and preventive screening. It is a chance to share any concerns and updates on your health, and to receive a thorough exam.  Visit your dentist for a routine exam and preventive care every 6 months. Brush your teeth twice a day and floss once a day. Good oral hygiene prevents tooth decay and gum disease.  The frequency of eye exams is based on your age, health, family medical history, use of contact lenses, and other factors. Follow your caregiver's recommendations for frequency of eye exams.  Eat a healthy diet. Foods like vegetables, fruits, whole grains, low-fat dairy products, and lean protein foods contain the nutrients you need without too many calories. Decrease your intake of foods high in solid fats, added sugars, and salt. Eat the right amount of calories for you.Get information about a proper diet from your caregiver, if necessary.  Regular physical exercise is one of the most important things you can do for your health. Most adults should get at least 150 minutes of moderate-intensity exercise (any activity that increases your heart rate and causes you to sweat) each week. In addition, most adults need muscle-strengthening exercises on 2 or more days a week.  Maintain a healthy weight. The body mass index (BMI) is a screening tool to identify possible weight problems. It provides an estimate of body fat based on height and weight. Your caregiver can help determine your BMI, and can help you achieve or maintain a healthy weight.For adults 20 years and older:  A BMI below 18.5 is considered underweight.  A BMI of 18.5 to 24.9 is normal.  A BMI of 25 to 29.9 is considered overweight.  A BMI of 30 and above is  considered obese.  Maintain normal blood lipids and cholesterol levels by exercising and minimizing your intake of saturated fat. Eat a balanced diet with plenty of fruit and vegetables. Blood tests for lipids and cholesterol should begin at age 20 and be repeated every 5 years. If your lipid or cholesterol levels are high, you are over 50, or you are at high risk for heart disease, you may need your cholesterol levels checked more frequently.Ongoing high lipid and cholesterol levels should be treated with medicines if diet and exercise are not effective.  If you smoke, find out from your caregiver how to quit. If you do not use tobacco, do not start.  If you are pregnant, do not drink alcohol. If you are breastfeeding, be very cautious about drinking alcohol. If you are not pregnant and choose to drink alcohol, do not exceed 1 drink per day. One drink is considered to be 12 ounces (355 mL) of beer, 5 ounces (148 mL) of wine, or 1.5 ounces (44 mL) of liquor.  Avoid use of street drugs. Do not share needles with anyone. Ask for help if you need support or instructions about stopping the use of drugs.  High blood pressure causes heart disease and increases the risk of stroke. Your blood pressure should be checked at least every 1 to 2 years. Ongoing high blood pressure should be treated with medicines if weight loss and exercise are not effective.  If you are 55 to 23 years old, ask your caregiver if you should take aspirin to prevent strokes.  Diabetes   screening involves taking a blood sample to check your fasting blood sugar level. This should be done once every 3 years, after age 45, if you are within normal weight and without risk factors for diabetes. Testing should be considered at a younger age or be carried out more frequently if you are overweight and have at least 1 risk factor for diabetes.  Breast cancer screening is essential preventive care for women. You should practice "breast  self-awareness." This means understanding the normal appearance and feel of your breasts and may include breast self-examination. Any changes detected, no matter how small, should be reported to a caregiver. Women in their 20s and 30s should have a clinical breast exam (CBE) by a caregiver as part of a regular health exam every 1 to 3 years. After age 40, women should have a CBE every year. Starting at age 40, women should consider having a mammography (breast X-ray test) every year. Women who have a family history of breast cancer should talk to their caregiver about genetic screening. Women at a high risk of breast cancer should talk to their caregivers about having magnetic resonance imaging (MRI) and a mammography every year.  The Pap test is a screening test for cervical cancer. A Pap test can show cell changes on the cervix that might become cervical cancer if left untreated. A Pap test is a procedure in which cells are obtained and examined from the lower end of the uterus (cervix).  Women should have a Pap test starting at age 21.  Between ages 21 and 29, Pap tests should be repeated every 2 years.  Beginning at age 30, you should have a Pap test every 3 years as long as the past 3 Pap tests have been normal.  Some women have medical problems that increase the chance of getting cervical cancer. Talk to your caregiver about these problems. It is especially important to talk to your caregiver if a new problem develops soon after your last Pap test. In these cases, your caregiver may recommend more frequent screening and Pap tests.  The above recommendations are the same for women who have or have not gotten the vaccine for human papillomavirus (HPV).  If you had a hysterectomy for a problem that was not cancer or a condition that could lead to cancer, then you no longer need Pap tests. Even if you no longer need a Pap test, a regular exam is a good idea to make sure no other problems are  starting.  If you are between ages 65 and 70, and you have had normal Pap tests going back 10 years, you no longer need Pap tests. Even if you no longer need a Pap test, a regular exam is a good idea to make sure no other problems are starting.  If you have had past treatment for cervical cancer or a condition that could lead to cancer, you need Pap tests and screening for cancer for at least 20 years after your treatment.  If Pap tests have been discontinued, risk factors (such as a new sexual partner) need to be reassessed to determine if screening should be resumed.  The HPV test is an additional test that may be used for cervical cancer screening. The HPV test looks for the virus that can cause the cell changes on the cervix. The cells collected during the Pap test can be tested for HPV. The HPV test could be used to screen women aged 30 years and older, and should   be used in women of any age who have unclear Pap test results. After the age of 30, women should have HPV testing at the same frequency as a Pap test.  Colorectal cancer can be detected and often prevented. Most routine colorectal cancer screening begins at the age of 50 and continues through age 75. However, your caregiver may recommend screening at an earlier age if you have risk factors for colon cancer. On a yearly basis, your caregiver may provide home test kits to check for hidden blood in the stool. Use of a small camera at the end of a tube, to directly examine the colon (sigmoidoscopy or colonoscopy), can detect the earliest forms of colorectal cancer. Talk to your caregiver about this at age 50, when routine screening begins. Direct examination of the colon should be repeated every 5 to 10 years through age 75, unless early forms of pre-cancerous polyps or small growths are found.  Hepatitis C blood testing is recommended for all people born from 1945 through 1965 and any individual with known risks for hepatitis C.  Practice  safe sex. Use condoms and avoid high-risk sexual practices to reduce the spread of sexually transmitted infections (STIs). STIs include gonorrhea, chlamydia, syphilis, trichomonas, herpes, HPV, and human immunodeficiency virus (HIV). Herpes, HIV, and HPV are viral illnesses that have no cure. They can result in disability, cancer, and death. Sexually active women aged 25 and younger should be checked for chlamydia. Older women with new or multiple partners should also be tested for chlamydia. Testing for other STIs is recommended if you are sexually active and at increased risk.  Osteoporosis is a disease in which the bones lose minerals and strength with aging. This can result in serious bone fractures. The risk of osteoporosis can be identified using a bone density scan. Women ages 65 and over and women at risk for fractures or osteoporosis should discuss screening with their caregivers. Ask your caregiver whether you should take a calcium supplement or vitamin D to reduce the rate of osteoporosis.  Menopause can be associated with physical symptoms and risks. Hormone replacement therapy is available to decrease symptoms and risks. You should talk to your caregiver about whether hormone replacement therapy is right for you.  Use sunscreen with sun protection factor (SPF) of 30 or more. Apply sunscreen liberally and repeatedly throughout the day. You should seek shade when your shadow is shorter than you. Protect yourself by wearing long sleeves, pants, a wide-brimmed hat, and sunglasses year round, whenever you are outdoors.  Once a month, do a whole body skin exam, using a mirror to look at the skin on your back. Notify your caregiver of new moles, moles that have irregular borders, moles that are larger than a pencil eraser, or moles that have changed in shape or color.  Stay current with required immunizations.  Influenza. You need a dose every fall (or winter). The composition of the flu vaccine  changes each year, so being vaccinated once is not enough.  Pneumococcal polysaccharide. You need 1 to 2 doses if you smoke cigarettes or if you have certain chronic medical conditions. You need 1 dose at age 65 (or older) if you have never been vaccinated.  Tetanus, diphtheria, pertussis (Tdap, Td). Get 1 dose of Tdap vaccine if you are younger than age 65, are over 65 and have contact with an infant, are a healthcare worker, are pregnant, or simply want to be protected from whooping cough. After that, you need a Td   booster dose every 10 years. Consult your caregiver if you have not had at least 3 tetanus and diphtheria-containing shots sometime in your life or have a deep or dirty wound.  HPV. You need this vaccine if you are a woman age 26 or younger. The vaccine is given in 3 doses over 6 months.  Measles, mumps, rubella (MMR). You need at least 1 dose of MMR if you were born in 1957 or later. You may also need a second dose.  Meningococcal. If you are age 19 to 21 and a first-year college student living in a residence hall, or have one of several medical conditions, you need to get vaccinated against meningococcal disease. You may also need additional booster doses.  Zoster (shingles). If you are age 60 or older, you should get this vaccine.  Varicella (chickenpox). If you have never had chickenpox or you were vaccinated but received only 1 dose, talk to your caregiver to find out if you need this vaccine.  Hepatitis A. You need this vaccine if you have a specific risk factor for hepatitis A virus infection or you simply wish to be protected from this disease. The vaccine is usually given as 2 doses, 6 to 18 months apart.  Hepatitis B. You need this vaccine if you have a specific risk factor for hepatitis B virus infection or you simply wish to be protected from this disease. The vaccine is given in 3 doses, usually over 6 months. Preventive Services / Frequency Ages 19 to 39  Blood  pressure check.** / Every 1 to 2 years.  Lipid and cholesterol check.** / Every 5 years beginning at age 20.  Clinical breast exam.** / Every 3 years for women in their 20s and 30s.  Pap test.** / Every 2 years from ages 21 through 29. Every 3 years starting at age 30 through age 65 or 70 with a history of 3 consecutive normal Pap tests.  HPV screening.** / Every 3 years from ages 30 through ages 65 to 70 with a history of 3 consecutive normal Pap tests.  Hepatitis C blood test.** / For any individual with known risks for hepatitis C.  Skin self-exam. / Monthly.  Influenza immunization.** / Every year.  Pneumococcal polysaccharide immunization.** / 1 to 2 doses if you smoke cigarettes or if you have certain chronic medical conditions.  Tetanus, diphtheria, pertussis (Tdap, Td) immunization. / A one-time dose of Tdap vaccine. After that, you need a Td booster dose every 10 years.  HPV immunization. / 3 doses over 6 months, if you are 26 and younger.  Measles, mumps, rubella (MMR) immunization. / You need at least 1 dose of MMR if you were born in 1957 or later. You may also need a second dose.  Meningococcal immunization. / 1 dose if you are age 19 to 21 and a first-year college student living in a residence hall, or have one of several medical conditions, you need to get vaccinated against meningococcal disease. You may also need additional booster doses.  Varicella immunization.** / Consult your caregiver.  Hepatitis A immunization.** / Consult your caregiver. 2 doses, 6 to 18 months apart.  Hepatitis B immunization.** / Consult your caregiver. 3 doses usually over 6 months. Ages 40 to 64  Blood pressure check.** / Every 1 to 2 years.  Lipid and cholesterol check.** / Every 5 years beginning at age 20.  Clinical breast exam.** / Every year after age 40.  Mammogram.** / Every year beginning at age 40   and continuing for as long as you are in good health. Consult with your  caregiver.  Pap test.** / Every 3 years starting at age 30 through age 65 or 70 with a history of 3 consecutive normal Pap tests.  HPV screening.** / Every 3 years from ages 30 through ages 65 to 70 with a history of 3 consecutive normal Pap tests.  Fecal occult blood test (FOBT) of stool. / Every year beginning at age 50 and continuing until age 75. You may not need to do this test if you get a colonoscopy every 10 years.  Flexible sigmoidoscopy or colonoscopy.** / Every 5 years for a flexible sigmoidoscopy or every 10 years for a colonoscopy beginning at age 50 and continuing until age 75.  Hepatitis C blood test.** / For all people born from 1945 through 1965 and any individual with known risks for hepatitis C.  Skin self-exam. / Monthly.  Influenza immunization.** / Every year.  Pneumococcal polysaccharide immunization.** / 1 to 2 doses if you smoke cigarettes or if you have certain chronic medical conditions.  Tetanus, diphtheria, pertussis (Tdap, Td) immunization.** / A one-time dose of Tdap vaccine. After that, you need a Td booster dose every 10 years.  Measles, mumps, rubella (MMR) immunization. / You need at least 1 dose of MMR if you were born in 1957 or later. You may also need a second dose.  Varicella immunization.** / Consult your caregiver.  Meningococcal immunization.** / Consult your caregiver.  Hepatitis A immunization.** / Consult your caregiver. 2 doses, 6 to 18 months apart.  Hepatitis B immunization.** / Consult your caregiver. 3 doses, usually over 6 months. Ages 65 and over  Blood pressure check.** / Every 1 to 2 years.  Lipid and cholesterol check.** / Every 5 years beginning at age 20.  Clinical breast exam.** / Every year after age 40.  Mammogram.** / Every year beginning at age 40 and continuing for as long as you are in good health. Consult with your caregiver.  Pap test.** / Every 3 years starting at age 30 through age 65 or 70 with a 3  consecutive normal Pap tests. Testing can be stopped between 65 and 70 with 3 consecutive normal Pap tests and no abnormal Pap or HPV tests in the past 10 years.  HPV screening.** / Every 3 years from ages 30 through ages 65 or 70 with a history of 3 consecutive normal Pap tests. Testing can be stopped between 65 and 70 with 3 consecutive normal Pap tests and no abnormal Pap or HPV tests in the past 10 years.  Fecal occult blood test (FOBT) of stool. / Every year beginning at age 50 and continuing until age 75. You may not need to do this test if you get a colonoscopy every 10 years.  Flexible sigmoidoscopy or colonoscopy.** / Every 5 years for a flexible sigmoidoscopy or every 10 years for a colonoscopy beginning at age 50 and continuing until age 75.  Hepatitis C blood test.** / For all people born from 1945 through 1965 and any individual with known risks for hepatitis C.  Osteoporosis screening.** / A one-time screening for women ages 65 and over and women at risk for fractures or osteoporosis.  Skin self-exam. / Monthly.  Influenza immunization.** / Every year.  Pneumococcal polysaccharide immunization.** / 1 dose at age 65 (or older) if you have never been vaccinated.  Tetanus, diphtheria, pertussis (Tdap, Td) immunization. / A one-time dose of Tdap vaccine if you are over   65 and have contact with an infant, are a healthcare worker, or simply want to be protected from whooping cough. After that, you need a Td booster dose every 10 years.  Varicella immunization.** / Consult your caregiver.  Meningococcal immunization.** / Consult your caregiver.  Hepatitis A immunization.** / Consult your caregiver. 2 doses, 6 to 18 months apart.  Hepatitis B immunization.** / Check with your caregiver. 3 doses, usually over 6 months. ** Family history and personal history of risk and conditions may change your caregiver's recommendations. Document Released: 07/14/2001 Document Revised: 08/10/2011  Document Reviewed: 10/13/2010 [employer] Patient Information 2013 [employer].  

## 2012-07-31 ENCOUNTER — Encounter: Payer: Self-pay | Admitting: Family Medicine

## 2012-07-31 DIAGNOSIS — S93409A Sprain of unspecified ligament of unspecified ankle, initial encounter: Secondary | ICD-10-CM

## 2012-07-31 HISTORY — DX: Sprain of unspecified ligament of unspecified ankle, initial encounter: S93.409A

## 2012-07-31 NOTE — Assessment & Plan Note (Signed)
Pap today. Continue OCP encouraged barrier method as well.

## 2012-07-31 NOTE — Assessment & Plan Note (Signed)
Has been accepted to [facility] dental [facility]. Is doing well, fasting labs today unremarkable. First pap done today.

## 2012-07-31 NOTE — Progress Notes (Signed)
Patient ID: Bonnie Hayes, female   DOB: 04/27/1991, 22 y.o.   MRN: [id number] TYQUASIA PANT [id number] January 24, 1991 07/31/2012      Progress Note New Patient  Subjective  Chief Complaint  Chief Complaint  Patient presents with  . Annual Exam    physical    HPI  Patient is a 22 year old Caucasian female who is in today for annual exam. She has been accepted to [employer] side. No recent illness. No fevers or chills. No chest pain or palpitations. She did recently over her right ankle had some swelling but it is improving. She's very minimal discomfort left. No GI or GU complaints noted today. No shortness of breath or headaches.  Past Medical History  Diagnosis Date  . Left wrist fracture     at radial aspect, no surgical correction required  . Preventative health care 07/24/2011  . Allergic state 07/26/2011  . Sinusitis 06/08/2012  . Viral URI 06/08/2012  . Sinusitis 06/08/2012  . Sprain of ankle, unspecified site 07/31/2012    Past Surgical History  Procedure Laterality Date  . Teeth implanted    . Thumb corrected      left thumb surgically corrected    Family History  Problem Relation Age of Onset  . Hypertension Mother   . Mental illness Mother     anxiety  . Hyperlipidemia Father   . Hypertension Maternal Grandmother   . Heart murmur Maternal Grandfather   . Coronary artery disease Maternal Grandfather     s/p CABG X6 vessels and pacer placed  . Diabetes Paternal Grandmother   . Dementia Paternal Grandmother   . Hyperlipidemia Paternal Grandfather   . Cancer Paternal Grandfather     prostate    History   Social History  . Marital Status: Single    Spouse Name: N/A    Number of Children: N/A  . Years of Education: N/A   Occupational History  . Not on file.   Social History Main Topics  . Smoking status: Never Smoker   . Smokeless tobacco: Not on file  . Alcohol Use: Yes     Comment: occasionally  . Drug Use: No  . Sexually  Active: No   Other Topics Concern  . Not on file   Social History Narrative  . No narrative on file    Current Outpatient Prescriptions on File Prior to Visit  Medication Sig Dispense Refill  . fluticasone (FLONASE) 50 MCG/ACT nasal spray Place 2 sprays into the nose daily.  16 g  6  . VIORELE 0.15-0.02/0.01 MG (21/5) tablet TAKE 1 TABLET BY MOUTH DAILY.  84 tablet  3  . ibuprofen (ADVIL,MOTRIN) 200 MG tablet Take 200 mg by mouth every 6 (six) hours as needed.       No current facility-administered medications on file prior to visit.    No Known Allergies  Review of Systems  Review of Systems  Constitutional: Negative for fever, chills and malaise/fatigue.  HENT: Negative for hearing loss, nosebleeds and congestion.   Eyes: Negative for discharge.  Respiratory: Negative for cough, sputum production, shortness of breath and wheezing.   Cardiovascular: Negative for chest pain, palpitations and leg swelling.  Gastrointestinal: Negative for heartburn, nausea, vomiting, abdominal pain, diarrhea, constipation and blood in stool.  Genitourinary: Negative for dysuria, urgency, frequency and hematuria.  Musculoskeletal: Negative for myalgias, back pain and falls.  Skin: Negative for rash.  Neurological: Negative for dizziness, tremors, sensory change, focal weakness, loss of  consciousness, weakness and headaches.  Endo/Heme/Allergies: Negative for polydipsia. Does not bruise/bleed easily.  Psychiatric/Behavioral: Negative for depression and suicidal ideas. The patient is not nervous/anxious and does not have insomnia.     Objective  BP 119/76  Pulse 66  Temp(Src) 99 F (37.2 C) (Temporal)  Ht 5\' 5"  (1.651 m)  Wt 123 lb (55.792 kg)  BMI 20.47 kg/m2  SpO2 100%  LMP 07/18/2012  Physical Exam  Physical Exam  Constitutional: She is oriented to person, place, and time and well-developed, well-nourished, and in no distress. No distress.  HENT:  Head: Normocephalic and  atraumatic.  Right Ear: External ear normal.  Left Ear: External ear normal.  Nose: Nose normal.  Mouth/Throat: Oropharynx is clear and moist. No oropharyngeal exudate.  Eyes: Conjunctivae are normal. Pupils are equal, round, and reactive to light. Right eye exhibits no discharge. Left eye exhibits no discharge. No scleral icterus.  Neck: Normal range of motion. Neck supple. No thyromegaly present.  Cardiovascular: Normal rate, regular rhythm, normal heart sounds and intact distal pulses.   No murmur heard. Pulmonary/Chest: Effort normal and breath sounds normal. No respiratory distress. She has no wheezes. She has no rales.  Abdominal: Soft. Bowel sounds are normal. She exhibits no distension and no mass. There is no tenderness.  Genitourinary: Uterus normal and cervix normal. Vaginal discharge found.  Thick white discharge  Musculoskeletal: Normal range of motion. She exhibits no edema and no tenderness.  Lymphadenopathy:    She has no cervical adenopathy.  Neurological: She is alert and oriented to person, place, and time. She has normal reflexes. No cranial nerve deficit. Coordination normal.  Skin: Skin is warm and dry. No rash noted. She is not diaphoretic.  Psychiatric: Mood, memory and affect normal.       Assessment & Plan  Preventative health care Has been accepted to [facility] dental school. Is doing well, fasting labs today unremarkable. First pap done today.   Cervical cancer screening Pap today. Continue OCP encouraged barrier method as well.  Sprain of ankle, unspecified site Healing well, encouraged some strengthening exercises.   Vaginitis: encouraged probiotics and sent for culture.

## 2012-07-31 NOTE — Assessment & Plan Note (Signed)
Healing well, encouraged some strengthening exercises.

## 2012-08-24 ENCOUNTER — Telehealth: Payer: Self-pay

## 2012-08-24 NOTE — Telephone Encounter (Signed)
Patient left a message asking for her immunization report to be mailed to her house. I mailed paperwork

## 2012-10-03 ENCOUNTER — Ambulatory Visit: Payer: BC Managed Care – PPO

## 2012-10-04 ENCOUNTER — Ambulatory Visit: Payer: BC Managed Care – PPO

## 2012-10-04 ENCOUNTER — Telehealth: Payer: Self-pay

## 2012-10-04 ENCOUNTER — Ambulatory Visit (INDEPENDENT_AMBULATORY_CARE_PROVIDER_SITE_OTHER): Payer: BC Managed Care – PPO | Admitting: Family Medicine

## 2012-10-04 ENCOUNTER — Encounter: Payer: Self-pay | Admitting: Family Medicine

## 2012-10-04 VITALS — BP 116/68 | HR 67 | Temp 98.3°F | Ht 65.0 in | Wt 120.1 lb

## 2012-10-04 DIAGNOSIS — B354 Tinea corporis: Secondary | ICD-10-CM

## 2012-10-04 DIAGNOSIS — Z0184 Encounter for antibody response examination: Secondary | ICD-10-CM

## 2012-10-04 DIAGNOSIS — Z23 Encounter for immunization: Secondary | ICD-10-CM

## 2012-10-04 MED ORDER — CLOTRIMAZOLE-BETAMETHASONE 1-0.05 % EX CREA: TOPICAL_CREAM | Freq: Two times a day (BID) | CUTANEOUS | Status: DC

## 2012-10-04 NOTE — Patient Instructions (Addendum)
Ringworm, Body [Tinea Corporis] Ringworm is a fungal infection of the skin and hair. Another name for this problem is Tinea Corporis. It has nothing to do with worms. A fungus is an organism that lives on dead cells (the outer layer of skin). It can involve the entire body. It can spread from infected pets. Tinea corporis can be a problem in wrestlers who may get the infection form other players/opponents, equipment and mats. DIAGNOSIS  A skin scraping can be obtained from the affected area and by looking for fungus under the microscope. This is called a KOH examination.  HOME CARE INSTRUCTIONS   Ringworm may be treated with a topical antifungal cream, ointment, or oral medications.  If you are using a cream or ointment, wash infected skin. Dry it completely before application.  Scrub the skin with a buff puff or abrasive sponge using a shampoo with ketoconazole to remove dead skin and help treat the ringworm.  Have your pet treated by your veterinarian if it has the same infection. SEEK MEDICAL CARE IF:   Your ringworm patch (fungus) continues to spread after 7 days of treatment.  Your rash is not gone in 4 weeks. Fungal infections are slow to respond to treatment. Some redness (erythema) may remain for several weeks after the fungus is gone.  The area becomes red, warm, tender, and swollen beyond the patch. This may be a secondary bacterial (germ) infection.  You have a fever. Document Released: 05/15/2000 Document Revised: 08/10/2011 Document Reviewed: 10/26/2008 ExitCare Patient Information 2013 [employer].  

## 2012-10-04 NOTE — Telephone Encounter (Signed)
Labs placed.

## 2012-10-05 ENCOUNTER — Encounter: Payer: Self-pay | Admitting: Family Medicine

## 2012-10-05 LAB — HEPATITIS B SURFACE ANTIBODY, QUANTITATIVE: Hepatitis B-Post: 34.2 m[IU]/mL

## 2012-10-05 NOTE — Progress Notes (Signed)
Patient ID: Bonnie Hayes, female   DOB: [birth year], 22 y.o.   MRN: [id number] KEIRSTON SAEPHANH [id number] 07-04-1990 10/05/2012      Progress Note-Follow Up  Subjective  Chief Complaint  Chief Complaint  Patient presents with  . Insect Bite    spider on left side   . Injections    tb skin test    HPI  Patient is a 22 year old Caucasian female who is in today for some lab work to fill out coliforms but is noting a rash under her left breast. It is mildly itchy and irritated initially she thought it was a spider bite but now she's not sure. It is not painful. It is not spreading. No fevers or chills. No malaise or myalgias. No chest pain, palpitations or shortness of breath.  Past Medical History  Diagnosis Date  . Left wrist fracture     at radial aspect, no surgical correction required  . Preventative health care 07/24/2011  . Allergic state 07/26/2011  . Sinusitis 06/08/2012  . Viral URI 06/08/2012  . Sinusitis 06/08/2012  . Sprain of ankle, unspecified site 07/31/2012    Past Surgical History  Procedure Laterality Date  . Teeth implanted    . Thumb corrected      left thumb surgically corrected    Family History  Problem Relation Age of Onset  . Hypertension Mother   . Mental illness Mother     anxiety  . Hyperlipidemia Father   . Hypertension Maternal Grandmother   . Heart murmur Maternal Grandfather   . Coronary artery disease Maternal Grandfather     s/p CABG X6 vessels and pacer placed  . Diabetes Paternal Grandmother   . Dementia Paternal Grandmother   . Hyperlipidemia Paternal Grandfather   . Cancer Paternal Grandfather     prostate    History   Social History  . Marital Status: Single    Spouse Name: N/A    Number of Children: N/A  . Years of Education: N/A   Occupational History  . Not on file.   Social History Main Topics  . Smoking status: Never Smoker   . Smokeless tobacco: Not on file  . Alcohol Use: Yes     Comment: occasionally  . Drug  Use: No  . Sexually Active: No   Other Topics Concern  . Not on file   Social History Narrative  . No narrative on file    Current Outpatient Prescriptions on File Prior to Visit  Medication Sig Dispense Refill  . adapalene (DIFFERIN) 0.1 % gel Apply topically at bedtime.  45 g  1  . DYMISTA 137-50 MCG/ACT SUSP       . PROAIR HFA 108 (90 BASE) MCG/ACT inhaler       . VIORELE 0.15-0.02/0.01 MG (21/5) tablet TAKE 1 TABLET BY MOUTH DAILY.  84 tablet  3   No current facility-administered medications on file prior to visit.    No Known Allergies  Review of Systems  Review of Systems  Constitutional: Negative for fever and malaise/fatigue.  HENT: Negative for congestion.   Eyes: Negative for discharge.  Respiratory: Negative for shortness of breath.   Cardiovascular: Negative for chest pain, palpitations and leg swelling.  Gastrointestinal: Negative for nausea, abdominal pain and diarrhea.  Genitourinary: Negative for dysuria.  Musculoskeletal: Negative for falls.  Skin: Positive for rash.  Neurological: Negative for loss of consciousness and headaches.  Endo/Heme/Allergies: Negative for polydipsia.  Psychiatric/Behavioral: Negative for depression and suicidal ideas. The patient  is not nervous/anxious and does not have insomnia.     Objective  BP 116/68  Pulse 67  Temp(Src) 98.3 F (36.8 C) (Oral)  Ht 5\' 5"  (1.651 m)  Wt 120 lb 1.9 oz (54.486 kg)  BMI 19.99 kg/m2  SpO2 98%  LMP 09/26/2012  Physical Exam  Physical Exam  Constitutional: She is oriented to person, place, and time and well-developed, well-nourished, and in no distress. No distress.  HENT:  Head: Normocephalic and atraumatic.  Eyes: Conjunctivae are normal.  Neck: Neck supple. No thyromegaly present.  Cardiovascular: Normal rate, regular rhythm and normal heart sounds.   No murmur heard. Pulmonary/Chest: Effort normal and breath sounds normal. She has no wheezes.  Abdominal: She exhibits no  distension and no mass.  Musculoskeletal: She exhibits no edema.  Lymphadenopathy:    She has no cervical adenopathy.  Neurological: She is alert and oriented to person, place, and time.  Skin: Skin is warm and dry. Rash noted. She is not diaphoretic. There is erythema.  1 cm raised, circular, erythematous lesion under left breast.  Psychiatric: Memory, affect and judgment normal.    Lab Results  Component Value Date   TSH 2.82 07/29/2012   Lab Results  Component Value Date   WBC 6.1 07/29/2012   HGB 13.2 07/29/2012   HCT 39.3 07/29/2012   MCV 92.1 07/29/2012   PLT 207.0 07/29/2012   Lab Results  Component Value Date   CREATININE 0.7 07/29/2012   BUN 10 07/29/2012   NA 140 07/29/2012   K 3.6 07/29/2012   CL 104 07/29/2012   CO2 26 07/29/2012   Lab Results  Component Value Date   ALT 9 07/29/2012   AST 16 07/29/2012   ALKPHOS 39 07/29/2012   BILITOT 0.9 07/29/2012   Lab Results  Component Value Date   CHOL 177 07/29/2012   Lab Results  Component Value Date   HDL 61.80 07/29/2012   Lab Results  Component Value Date   LDLCALC 87 07/29/2012   Lab Results  Component Value Date   TRIG 141.0 07/29/2012   Lab Results  Component Value Date   CHOLHDL 3 07/29/2012     Assessment & Plan  TINEA CORPORIS Under left breast. Given rx for Lotrisone to apply bid and asked to start a probiotic. Report if symptoms worsen or do not resolve

## 2012-10-05 NOTE — Assessment & Plan Note (Addendum)
Under left breast. Given rx for Lotrisone to apply bid and asked to start a probiotic. Report if symptoms worsen or do not resolve

## 2012-10-06 ENCOUNTER — Ambulatory Visit: Payer: BC Managed Care – PPO

## 2012-11-22 ENCOUNTER — Ambulatory Visit (INDEPENDENT_AMBULATORY_CARE_PROVIDER_SITE_OTHER): Payer: BC Managed Care – PPO

## 2012-11-22 DIAGNOSIS — Z23 Encounter for immunization: Secondary | ICD-10-CM

## 2012-11-22 NOTE — Progress Notes (Signed)
  Subjective:    Patient ID: Bonnie Hayes, female    DOB: 1990-08-05, 22 y.o.   MRN: [id number]  HPI    Review of Systems     Objective:   Physical Exam        Assessment & Plan:  Patient came in today for her PPD placement. Pt tolerated well and was informed to come back at this time on Thursday. Pt voiced understanding

## 2012-11-24 LAB — TB SKIN TEST: TB Skin Test: NEGATIVE

## 2013-07-05 ENCOUNTER — Telehealth: Payer: Self-pay | Admitting: Family Medicine

## 2013-07-05 NOTE — Telephone Encounter (Signed)
Refill- viorele 28 day tablet

## 2013-07-06 MED ORDER — DESOGESTREL-ETHINYL ESTRADIOL 0.15-0.02/0.01 MG (21/5) PO TABS: 1.0000 | ORAL_TABLET | Freq: Every day | ORAL | Status: DC

## 2013-08-02 ENCOUNTER — Encounter: Payer: BC Managed Care – PPO | Admitting: Family Medicine

## 2013-08-18 ENCOUNTER — Encounter: Payer: BC Managed Care – PPO | Admitting: Family Medicine

## 2013-09-19 ENCOUNTER — Ambulatory Visit (INDEPENDENT_AMBULATORY_CARE_PROVIDER_SITE_OTHER): Payer: BC Managed Care – PPO | Admitting: Family Medicine

## 2013-09-19 ENCOUNTER — Encounter: Payer: Self-pay | Admitting: Family Medicine

## 2013-09-19 VITALS — BP 118/72 | HR 67 | Temp 98.7°F | Ht 65.0 in | Wt 120.1 lb

## 2013-09-19 DIAGNOSIS — Z309 Encounter for contraceptive management, unspecified: Secondary | ICD-10-CM

## 2013-09-19 DIAGNOSIS — Z7251 High risk heterosexual behavior: Secondary | ICD-10-CM

## 2013-09-19 DIAGNOSIS — J45909 Unspecified asthma, uncomplicated: Secondary | ICD-10-CM

## 2013-09-19 DIAGNOSIS — Z124 Encounter for screening for malignant neoplasm of cervix: Secondary | ICD-10-CM

## 2013-09-19 DIAGNOSIS — Z0000 Encounter for general adult medical examination without abnormal findings: Secondary | ICD-10-CM

## 2013-09-19 LAB — CBC
HEMATOCRIT: 38.1 % (ref 36.0–46.0)
Hemoglobin: 13.1 g/dL (ref 12.0–15.0)
MCH: 31 pg (ref 26.0–34.0)
MCHC: 34.4 g/dL (ref 30.0–36.0)
MCV: 90.3 fL (ref 78.0–100.0)
PLATELETS: 226 10*3/uL (ref 150–400)
RBC: 4.22 MIL/uL (ref 3.87–5.11)
RDW: 13.4 % (ref 11.5–15.5)
WBC: 5.8 10*3/uL (ref 4.0–10.5)

## 2013-09-19 LAB — RPR

## 2013-09-19 MED ORDER — DESOGESTREL-ETHINYL ESTRADIOL 0.15-0.02/0.01 MG (21/5) PO TABS: 1.0000 | ORAL_TABLET | Freq: Every day | ORAL | Status: AC

## 2013-09-19 MED ORDER — ALBUTEROL SULFATE HFA 108 (90 BASE) MCG/ACT IN AERS: 2.0000 | INHALATION_SPRAY | Freq: Four times a day (QID) | RESPIRATORY_TRACT | Status: DC | PRN

## 2013-09-19 MED ORDER — ALBUTEROL SULFATE HFA 108 (90 BASE) MCG/ACT IN AERS: 2.0000 | INHALATION_SPRAY | Freq: Four times a day (QID) | RESPIRATORY_TRACT | Status: AC | PRN

## 2013-09-19 MED ORDER — DESOGESTREL-ETHINYL ESTRADIOL 0.15-0.02/0.01 MG (21/5) PO TABS: 1.0000 | ORAL_TABLET | Freq: Every day | ORAL | Status: DC

## 2013-09-19 NOTE — Progress Notes (Signed)
Patient ID: Bonnie Hayes, female   DOB: 1991/01/19, 23 y.o.   MRN: [id number] Bonnie Hayes [id number] 1991/01/19 09/19/2013      Progress Note-Follow Up  Subjective  Chief Complaint  Chief Complaint  Patient presents with  . Annual Exam    physical    HPI  Patient is a 23 year old female in today for routine medical care. She is feeling well. She is home[facility] [employer] [facility] [employer]. She is requesting a refill on albuterol which she uses prior to exercise with good results. No recent flare her allergies. Her acne is well controlled. Her cycle started today. Denies CP/palp/SOB/HA/congestion/fevers/GI or GU c/o. Taking meds as prescribed   Past Medical History  Diagnosis Date  . Left wrist fracture     at radial aspect, no surgical correction required  . Preventative health care 07/24/2011  . Allergic state 07/26/2011  . Sinusitis 06/08/2012  . Viral URI 06/08/2012  . Sinusitis 06/08/2012  . Sprain of ankle, unspecified site 07/31/2012    Past Surgical History  Procedure Laterality Date  . Teeth implanted    . Thumb corrected      left thumb surgically corrected    Family History  Problem Relation Age of Onset  . Hypertension Mother   . Mental illness Mother     anxiety  . Hyperlipidemia Father   . Hypertension Maternal Grandmother   . Heart murmur Maternal Grandfather   . Coronary artery disease Maternal Grandfather     s/p CABG X6 vessels and pacer placed  . Diabetes Paternal Grandmother   . Dementia Paternal Grandmother   . Hyperlipidemia Paternal Grandfather   . Cancer Paternal Grandfather     prostate    History   Social History  . Marital Status: Single    Spouse Name: N/A    Number of Children: N/A  . Years of Education: N/A   Occupational History  . Not on file.   Social History Main Topics  . Smoking status: Never Smoker   . Smokeless tobacco: Not on file  . Alcohol Use: Yes     Comment: occasionally  . Drug Use: No  . Sexual Activity: No    Other Topics Concern  . Not on file   Social History Narrative  . No narrative on file    Current Outpatient Prescriptions on File Prior to Visit  Medication Sig Dispense Refill  . adapalene (DIFFERIN) 0.1 % gel Apply topically at bedtime.  45 g  1  . DYMISTA 137-50 MCG/ACT SUSP        No current facility-administered medications on file prior to visit.    No Known Allergies  Review of Systems  Review of Systems  Constitutional: Negative for fever and malaise/fatigue.  HENT: Negative for congestion.   Eyes: Negative for discharge.  Respiratory: Negative for shortness of breath.   Cardiovascular: Negative for chest pain, palpitations and leg swelling.  Gastrointestinal: Negative for nausea, abdominal pain and diarrhea.  Genitourinary: Negative for dysuria.  Musculoskeletal: Negative for falls.  Skin: Negative for rash.  Neurological: Negative for loss of consciousness and headaches.  Endo/Heme/Allergies: Negative for polydipsia.  Psychiatric/Behavioral: Negative for depression and suicidal ideas. The patient is not nervous/anxious and does not have insomnia.     Objective  BP 118/72  Pulse 67  Temp(Src) 98.7 F (37.1 C) (Oral)  Ht 5\' 5"  (1.651 m)  Wt 120 lb 1.9 oz (54.486 kg)  BMI 19.99 kg/m2  SpO2 98%  LMP 09/18/2013  Physical Exam  Physical Exam  Constitutional: She is oriented to person, place, and time and well-developed, well-nourished, and in no distress. No distress.  HENT:  Head: Normocephalic and atraumatic.  Eyes: Conjunctivae are normal.  Neck: Neck supple. No thyromegaly present.  Cardiovascular: Normal rate, regular rhythm and normal heart sounds.   No murmur heard. Pulmonary/Chest: Effort normal and breath sounds normal. She has no wheezes.  Abdominal: She exhibits no distension and no mass.  Musculoskeletal: She exhibits no edema.  Lymphadenopathy:    She has no cervical adenopathy.  Neurological: She is alert and oriented to person,  place, and time.  Skin: Skin is warm and dry. No rash noted. She is not diaphoretic.  Psychiatric: Memory, affect and judgment normal.    Lab Results  Component Value Date   TSH 2.82 07/29/2012   Lab Results  Component Value Date   WBC 6.1 07/29/2012   HGB 13.2 07/29/2012   HCT 39.3 07/29/2012   MCV 92.1 07/29/2012   PLT 207.0 07/29/2012   Lab Results  Component Value Date   CREATININE 0.7 07/29/2012   BUN 10 07/29/2012   NA 140 07/29/2012   K 3.6 07/29/2012   CL 104 07/29/2012   CO2 26 07/29/2012   Lab Results  Component Value Date   ALT 9 07/29/2012   AST 16 07/29/2012   ALKPHOS 39 07/29/2012   BILITOT 0.9 07/29/2012   Lab Results  Component Value Date   CHOL 177 07/29/2012   Lab Results  Component Value Date   HDL 61.80 07/29/2012   Lab Results  Component Value Date   LDLCALC 87 07/29/2012   Lab Results  Component Value Date   TRIG 141.0 07/29/2012   Lab Results  Component Value Date   CHOLHDL 3 07/29/2012     Assessment & Plan  Preventative health care Patient encouraged to maintain heart healthy diet, regular exercise, adequate sleep. Consider daily probiotics. Take medications as prescribed  Cervical cancer screening Was here today for pap but cycle just started will return next week for pap. 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

## 2013-09-19 NOTE — Patient Instructions (Signed)
Preventive Dental Care Preventative dental care is an important part of overall health for children and adults. Regular care of the teeth and gums can prevent tooth decay and gum disease. Substances are created in your mouth every day that need to be removed, including:  Plaque. This is a sticky substance around the teeth and gums.  Tartar. This is a hardened form of plaque. You can remove plaque and tartar by brushing and flossing. Brushing and flossing will also help prevent cavities, gingivitis, and tooth loss. Your dentist can remove the plaque and tartar not removed with regular daily care. A dentist can also find other dental problems. Problems that are detected early can be treated conservatively and with less expense. BABIES, TODDLERS, AND PRESCHOOL AGED CHILDREN  Clean your baby's gums after feedings with a wet washcloth (water only).  Do not give your baby a bottle with milk, formula, or juice for sipping before they go to sleep.  Brush your baby's emerging teeth with a small toothbrush and water 2 times per day.  Begin flossing your baby's teeth when they are in contact with one another.  Many dental problems are preventable with early assessment. Take your child to a dentist when the first tooth erupts or by age 1. Discuss risks for tooth decay, your child's growth and development, fluoride needs, oral habits, proper diet, and oral hygiene.  Bring your child to the dentist every 6 months.  Even when your child is over age 2, brush your child's teeth for him or her. Use a small pea-sized amount of fluoride toothpaste. Make sure your child does not swallow the toothpaste.  Contact your dentist if your child has pain or other problems in the mouth. SCHOOL AGED CHILDREN AND ADOLESCENTS  Continue to brush your child's teeth 2 times per day with a child toothbrush and pea-sized amount of toothpaste until your child is 6 to 7 years old.  Help your child floss until he or she can do  it properly.  Bring your child to the dentist every 6 months for professional cleanings and exams.  If your child is involved in contact sports, make sure he or she wears a properly fitted mouth guard.  Ask your dentist about dental sealants. This is a coating painted onto the molars to prevent decay.   Encourage a healthy diet. Help your child limit sugary drinks and foods.  Make sure your child has an early orthodontic evaluation. Your child should have an evaluation by about age 7. Many orthodontic problems can be treated early, preventing the need for full fixed braces in the future.  Talk to your adolescent about the risks of oral piercings and smoking. Help your child avoid these risks.  Contact your dental caregiver if your child has pain or other problems in the mouth. ADULTS  Brush at least 2 times per day and after meals if possible. Brush for at least 2 minutes.  Use a fluoride toothpaste or drink fluoridated water.  Floss daily.  Keep retainers, dentures, or other mouth devices clean and sanitized. Soak or brush them as directed.  Eat a healthy diet. Limit sugary drinks and foods.  See your dentist every 6 months.  Follow up with your dentist as directed.  Always see your dentist at the first sign of tooth or gum pain.  Avoid smoking.  Limit alcohol. ELDERLY OR THOSE WITH A CHRONIC HEALTH CONDITION Follow the adult guidelines above, and in addition:  Manage chronic conditions, such as diabetes. Certain conditions   can increase your risk of gum disease.  If you experience dry mouth from medicines, ask your caregiver about treatment options. Try drinking more water and chewing sugarless gum. Avoid alcohol.  Visit your dentist prior to cancer treatment to take care of any problems. SEEK IMMEDIATE DENTAL CARE IF:  You develop pain, bleeding, or soreness in the gum, tooth, jaw, or mouth area.  A permanent tooth becomes loose or separated from the gum  socket.  You experience a blow or injury to the mouth or jaw area. Document Released: 09/12/2010 Document Revised: 08/10/2011 Document Reviewed: 09/12/2010 [facility] Patient Information 2014 [employer] [place].

## 2013-09-19 NOTE — Progress Notes (Signed)
Pre visit review using our clinic review tool, if applicable. No additional management support is needed unless otherwise documented below in the visit note. 

## 2013-09-20 LAB — HIV ANTIBODY (ROUTINE TESTING W REFLEX): HIV 1&2 Ab, 4th Generation: NONREACTIVE

## 2013-09-24 DIAGNOSIS — J45909 Unspecified asthma, uncomplicated: Secondary | ICD-10-CM | POA: Insufficient documentation

## 2013-09-24 NOTE — Assessment & Plan Note (Addendum)
Was here today for pap but cycle just started will return next week for pap. 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

## 2013-09-24 NOTE — Assessment & Plan Note (Signed)
Patient encouraged to maintain heart healthy diet, regular exercise, adequate sleep. Consider daily probiotics. Take medications as prescribed 

## 2013-09-29 ENCOUNTER — Ambulatory Visit (INDEPENDENT_AMBULATORY_CARE_PROVIDER_SITE_OTHER): Payer: BC Managed Care – PPO | Admitting: Family Medicine

## 2013-09-29 ENCOUNTER — Other Ambulatory Visit (HOSPITAL_COMMUNITY)
Admission: RE | Admit: 2013-09-29 | Discharge: 2013-09-29 | Disposition: A | Discharge status: Home / Self Care | Payer: BC Managed Care – PPO | Source: Ambulatory Visit | Attending: Family Medicine | Admitting: Family Medicine

## 2013-09-29 ENCOUNTER — Encounter: Payer: Self-pay | Admitting: Family Medicine

## 2013-09-29 VITALS — BP 108/68 | HR 76 | Temp 98.6°F | Ht 65.0 in | Wt 120.0 lb

## 2013-09-29 DIAGNOSIS — Z124 Encounter for screening for malignant neoplasm of cervix: Secondary | ICD-10-CM

## 2013-09-29 DIAGNOSIS — T7840XA Allergy, unspecified, initial encounter: Secondary | ICD-10-CM

## 2013-09-29 DIAGNOSIS — N760 Acute vaginitis: Secondary | ICD-10-CM | POA: Insufficient documentation

## 2013-09-29 DIAGNOSIS — Z01419 Encounter for gynecological examination (general) (routine) without abnormal findings: Secondary | ICD-10-CM | POA: Insufficient documentation

## 2013-09-29 DIAGNOSIS — Z113 Encounter for screening for infections with a predominantly sexual mode of transmission: Secondary | ICD-10-CM | POA: Insufficient documentation

## 2013-09-29 NOTE — Assessment & Plan Note (Signed)
No recent flares may use dymista and zyrtec prn

## 2013-09-29 NOTE — Progress Notes (Signed)
Pre visit review using our clinic review tool, if applicable. No additional management support is needed unless otherwise documented below in the visit note. 

## 2013-09-29 NOTE — Assessment & Plan Note (Signed)
Pap today, scant vaginal discharge noted and some hi risk behavior will check cultures and notify patient

## 2013-09-29 NOTE — Progress Notes (Signed)
Patient ID: Bonnie Hayes, female   DOB: 08-09-1990, 23 y.o.   MRN: [id number] Bonnie Hayes [id number] 08-09-1990 09/29/2013      Progress Note-Follow Up  Subjective  Chief Complaint  Chief Complaint  Patient presents with  . Gynecologic Exam    pap    HPI  Patient is a 23 year old female in today for routine medical care. In today for a Pap smear. She was in recently for her annual exam but unfortunately he just started a heavy cycle. Her cycle has ended without difficulty. Does have some scant vaginal discharge and is sexually active but offers no significant concerns. No complaints of chest pain, palpitations, shortness of breath, abdominal pain, back pain, fevers, GI or GU concerns noted.  Past Medical History  Diagnosis Date  . Left wrist fracture     at radial aspect, no surgical correction required  . Preventative health care 07/24/2011  . Allergic state 07/26/2011  . Sinusitis 06/08/2012  . Viral URI 06/08/2012  . Sinusitis 06/08/2012  . Sprain of ankle, unspecified site 07/31/2012    Past Surgical History  Procedure Laterality Date  . Teeth implanted    . Thumb corrected      left thumb surgically corrected    Family History  Problem Relation Age of Onset  . Hypertension Mother   . Mental illness Mother     anxiety  . Hyperlipidemia Father   . Hypertension Maternal Grandmother   . Heart murmur Maternal Grandfather   . Coronary artery disease Maternal Grandfather     s/p CABG X6 vessels and pacer placed  . Diabetes Paternal Grandmother   . Dementia Paternal Grandmother   . Hyperlipidemia Paternal Grandfather   . Cancer Paternal Grandfather     prostate    History   Social History  . Marital Status: Single    Spouse Name: N/A    Number of Children: N/A  . Years of Education: N/A   Occupational History  . Not on file.   Social History Main Topics  . Smoking status: Never Smoker   . Smokeless tobacco: Not on file  . Alcohol Use: Yes     Comment:  occasionally  . Drug Use: No  . Sexual Activity: No   Other Topics Concern  . Not on file   Social History Narrative  . No narrative on file    Current Outpatient Prescriptions on File Prior to Visit  Medication Sig Dispense Refill  . adapalene (DIFFERIN) 0.1 % gel Apply topically at bedtime.  45 g  1  . albuterol (PROAIR HFA) 108 (90 BASE) MCG/ACT inhaler Inhale 2 puffs into the lungs every 6 (six) hours as needed for wheezing or shortness of breath (before exercise).  1 Inhaler  3  . desogestrel-ethinyl estradiol (VIORELE) 0.15-0.02/0.01 MG (21/5) tablet Take 1 tablet by mouth daily.  3 Package  3  . DYMISTA 137-50 MCG/ACT SUSP        No current facility-administered medications on file prior to visit.    No Known Allergies  Review of Systems  Review of Systems  Constitutional: Negative for fever and malaise/fatigue.  HENT: Negative for congestion.   Eyes: Negative for discharge.  Respiratory: Negative for shortness of breath.   Cardiovascular: Negative for chest pain, palpitations and leg swelling.  Gastrointestinal: Negative for nausea, abdominal pain and diarrhea.  Genitourinary: Negative for dysuria.  Musculoskeletal: Negative for falls.  Skin: Negative for rash.  Neurological: Negative for loss of consciousness and headaches.  Endo/Heme/Allergies: Negative for polydipsia.  Psychiatric/Behavioral: Negative for depression and suicidal ideas. The patient is not nervous/anxious and does not have insomnia.     Objective  BP 108/68  Pulse 76  Temp(Src) 98.6 F (37 C) (Oral)  Ht 5\' 5"  (1.651 m)  Wt 120 lb (54.432 kg)  BMI 19.97 kg/m2  SpO2 99%  LMP 09/18/2013  Physical Exam  Physical Exam  Constitutional: She is oriented to person, place, and time and well-developed, well-nourished, and in no distress. No distress.  HENT:  Head: Normocephalic and atraumatic.  Eyes: Conjunctivae are normal.  Neck: Neck supple. No thyromegaly present.  Cardiovascular: Normal  rate, regular rhythm and normal heart sounds.   No murmur heard. Pulmonary/Chest: Effort normal and breath sounds normal. She has no wheezes.  Abdominal: She exhibits no distension and no mass.  Genitourinary: Vagina normal, uterus normal, cervix normal, right adnexa normal and left adnexa normal. No vaginal discharge found.  Musculoskeletal: She exhibits no edema.  Lymphadenopathy:    She has no cervical adenopathy.  Neurological: She is alert and oriented to person, place, and time.  Skin: Skin is warm and dry. No rash noted. She is not diaphoretic.  Psychiatric: Memory, affect and judgment normal.    Lab Results  Component Value Date   TSH 2.82 07/29/2012   Lab Results  Component Value Date   WBC 5.8 09/19/2013   HGB 13.1 09/19/2013   HCT 38.1 09/19/2013   MCV 90.3 09/19/2013   PLT 226 09/19/2013   Lab Results  Component Value Date   CREATININE 0.7 07/29/2012   BUN 10 07/29/2012   NA 140 07/29/2012   K 3.6 07/29/2012   CL 104 07/29/2012   CO2 26 07/29/2012   Lab Results  Component Value Date   ALT 9 07/29/2012   AST 16 07/29/2012   ALKPHOS 39 07/29/2012   BILITOT 0.9 07/29/2012   Lab Results  Component Value Date   CHOL 177 07/29/2012   Lab Results  Component Value Date   HDL 61.80 07/29/2012   Lab Results  Component Value Date   LDLCALC 87 07/29/2012   Lab Results  Component Value Date   TRIG 141.0 07/29/2012   Lab Results  Component Value Date   CHOLHDL 3 07/29/2012     Assessment & Plan   Cervical cancer screening Pap today, scant vaginal discharge noted and some hi risk behavior will check cultures and notify patient  Allergic state No recent flares may use dymista and zyrtec prn

## 2013-10-04 LAB — CERVICOVAGINAL ANCILLARY ONLY
BACTERIAL VAGINITIS: POSITIVE — AB
Bacterial vaginitis: POSITIVE — AB
Candida vaginitis: NEGATIVE

## 2013-10-05 MED ORDER — METRONIDAZOLE 500 MG PO TABS: 500.0000 mg | ORAL_TABLET | Freq: Three times a day (TID) | ORAL | Status: AC

## 2013-10-05 NOTE — Addendum Note (Signed)
Addended by: Court JoyFREEMAN, Naila Elizondo L on: 10/05/2013 03:08 PM   Modules accepted: Orders

## 2013-10-10 ENCOUNTER — Telehealth: Payer: Self-pay | Admitting: Family Medicine

## 2013-10-10 NOTE — Telephone Encounter (Signed)
Pt started taking antibiotic, now has diarrhea. Does she need to stop meds? Please advise.

## 2013-10-10 NOTE — Telephone Encounter (Signed)
Flagyl can cause diarrhea sometimes, she can stop it and we should start Cleocin gel 1 apllicator pv qhs x 5 days

## 2013-10-10 NOTE — Telephone Encounter (Signed)
See note change from Flagyl to Cleocin gel

## 2013-10-11 MED ORDER — CLINDAMYCIN PHOSPHATE 2 % VA CREA: 1.0000 | TOPICAL_CREAM | Freq: Every day | VAGINAL | Status: AC

## 2013-10-11 NOTE — Telephone Encounter (Signed)
Yes, that is correct 

## 2013-10-11 NOTE — Telephone Encounter (Signed)
Notified pt. She is requesting that we [facility] to [facility] but she is currently unsure of the address. She states she will call us back with correct location as [facility] [place] [facility] not the correct pharmacy that she was wanting.

## 2013-10-11 NOTE — Telephone Encounter (Signed)
Melissa, can you tell me if the cleocin rx pended is the correct dose that Dr Abner GreenspanBlyth is wanting me to order?

## 2013-10-11 NOTE — Telephone Encounter (Signed)
Rx sent 

## 2013-10-11 NOTE — Telephone Encounter (Signed)
Patient left message stating to [facility] [place]

## 2014-07-26 ENCOUNTER — Telehealth: Payer: Self-pay | Admitting: Family Medicine

## 2014-07-26 NOTE — Telephone Encounter (Signed)
Pt needs an appointment.  Last OV:  09/29/13.  Please call and schedule.

## 2014-07-26 NOTE — Telephone Encounter (Signed)
Lm on vm awaiting return call °

## 2014-07-26 NOTE — Telephone Encounter (Signed)
Caller name:Ameli  Relationship to patient:Self Can be reached:[phone] Pharmacy:  Reason for call:Wants to switch birthcontrol, current issues with sex drive and emotions. May leave message on VM if needed
